# Patient Record
Sex: Female | Born: 1965 | Race: Black or African American | Hispanic: No | Marital: Married | Smoking: Never smoker
Health system: Southern US, Community
[De-identification: ages and names within clinical notes are randomized; demographics above are authoritative.]

---

## 2000-05-27 HISTORY — PX: BREAST BIOPSY: SHX20

## 2001-09-15 ENCOUNTER — Encounter: Payer: Self-pay | Admitting: Internal Medicine

## 2001-09-15 ENCOUNTER — Encounter: Admission: RE | Admit: 2001-09-15 | Discharge: 2001-09-15 | Payer: Self-pay | Admitting: Internal Medicine

## 2003-05-23 ENCOUNTER — Other Ambulatory Visit: Admission: RE | Admit: 2003-05-23 | Discharge: 2003-05-23 | Payer: Self-pay | Admitting: Internal Medicine

## 2004-06-26 ENCOUNTER — Other Ambulatory Visit: Admission: RE | Admit: 2004-06-26 | Discharge: 2004-06-26 | Payer: Self-pay | Admitting: Internal Medicine

## 2005-08-05 ENCOUNTER — Encounter: Admission: RE | Admit: 2005-08-05 | Discharge: 2005-08-05 | Payer: Self-pay | Admitting: Internal Medicine

## 2005-10-01 ENCOUNTER — Other Ambulatory Visit: Admission: RE | Admit: 2005-10-01 | Discharge: 2005-10-01 | Payer: Self-pay | Admitting: Internal Medicine

## 2005-10-08 ENCOUNTER — Encounter: Admission: RE | Admit: 2005-10-08 | Discharge: 2005-10-08 | Payer: Self-pay | Admitting: Internal Medicine

## 2006-08-08 ENCOUNTER — Encounter: Admission: RE | Admit: 2006-08-08 | Discharge: 2006-08-08 | Payer: Self-pay | Admitting: Internal Medicine

## 2007-05-06 ENCOUNTER — Other Ambulatory Visit: Admission: RE | Admit: 2007-05-06 | Discharge: 2007-05-06 | Payer: Self-pay | Admitting: Pediatrics

## 2007-08-13 ENCOUNTER — Encounter: Admission: RE | Admit: 2007-08-13 | Discharge: 2007-08-13 | Payer: Self-pay | Admitting: Family Medicine

## 2008-08-18 ENCOUNTER — Encounter: Admission: RE | Admit: 2008-08-18 | Discharge: 2008-08-18 | Payer: Self-pay | Admitting: Family Medicine

## 2008-09-08 ENCOUNTER — Encounter: Admission: RE | Admit: 2008-09-08 | Discharge: 2008-09-08 | Payer: Self-pay | Admitting: Sports Medicine

## 2008-09-08 ENCOUNTER — Ambulatory Visit: Payer: Self-pay | Admitting: Sports Medicine

## 2008-09-08 DIAGNOSIS — M25559 Pain in unspecified hip: Secondary | ICD-10-CM

## 2009-08-24 ENCOUNTER — Encounter: Admission: RE | Admit: 2009-08-24 | Discharge: 2009-08-24 | Payer: Self-pay | Admitting: Family Medicine

## 2010-06-16 ENCOUNTER — Encounter: Payer: Self-pay | Admitting: Internal Medicine

## 2010-06-29 ENCOUNTER — Other Ambulatory Visit: Payer: Self-pay

## 2010-06-29 ENCOUNTER — Other Ambulatory Visit: Payer: Self-pay | Admitting: Family Medicine

## 2010-06-29 ENCOUNTER — Other Ambulatory Visit (HOSPITAL_COMMUNITY)
Admission: RE | Admit: 2010-06-29 | Discharge: 2010-06-29 | Disposition: A | Discharge status: Home / Self Care | Payer: PRIVATE HEALTH INSURANCE | Source: Ambulatory Visit | Attending: Family Medicine | Admitting: Family Medicine

## 2010-06-29 DIAGNOSIS — Z1239 Encounter for other screening for malignant neoplasm of breast: Secondary | ICD-10-CM

## 2010-06-29 DIAGNOSIS — Z01419 Encounter for gynecological examination (general) (routine) without abnormal findings: Secondary | ICD-10-CM | POA: Insufficient documentation

## 2010-08-31 ENCOUNTER — Ambulatory Visit: Payer: PRIVATE HEALTH INSURANCE

## 2010-09-18 ENCOUNTER — Ambulatory Visit
Admission: RE | Admit: 2010-09-18 | Discharge: 2010-09-18 | Disposition: A | Discharge status: Home / Self Care | Payer: PRIVATE HEALTH INSURANCE | Source: Ambulatory Visit | Attending: Family Medicine | Admitting: Family Medicine

## 2010-09-18 DIAGNOSIS — Z1239 Encounter for other screening for malignant neoplasm of breast: Secondary | ICD-10-CM

## 2011-07-30 ENCOUNTER — Other Ambulatory Visit: Payer: Self-pay

## 2011-07-30 ENCOUNTER — Other Ambulatory Visit (HOSPITAL_COMMUNITY)
Admission: RE | Admit: 2011-07-30 | Discharge: 2011-07-30 | Disposition: A | Discharge status: Home / Self Care | Payer: PRIVATE HEALTH INSURANCE | Source: Ambulatory Visit | Attending: Family Medicine | Admitting: Family Medicine

## 2011-07-30 DIAGNOSIS — Z01419 Encounter for gynecological examination (general) (routine) without abnormal findings: Secondary | ICD-10-CM | POA: Insufficient documentation

## 2011-08-27 ENCOUNTER — Other Ambulatory Visit: Payer: Self-pay | Admitting: Family Medicine

## 2011-08-27 DIAGNOSIS — Z1231 Encounter for screening mammogram for malignant neoplasm of breast: Secondary | ICD-10-CM

## 2011-09-20 ENCOUNTER — Ambulatory Visit
Admission: RE | Admit: 2011-09-20 | Discharge: 2011-09-20 | Disposition: A | Discharge status: Home / Self Care | Payer: PRIVATE HEALTH INSURANCE | Source: Ambulatory Visit | Attending: Family Medicine | Admitting: Family Medicine

## 2011-09-20 DIAGNOSIS — Z1231 Encounter for screening mammogram for malignant neoplasm of breast: Secondary | ICD-10-CM

## 2012-02-24 ENCOUNTER — Ambulatory Visit (INDEPENDENT_AMBULATORY_CARE_PROVIDER_SITE_OTHER): Payer: BC Managed Care – PPO | Admitting: Sports Medicine

## 2012-02-24 VITALS — BP 128/90 | Ht 67.0 in | Wt 193.0 lb

## 2012-02-24 DIAGNOSIS — M25559 Pain in unspecified hip: Secondary | ICD-10-CM

## 2012-02-24 DIAGNOSIS — M533 Sacrococcygeal disorders, not elsewhere classified: Secondary | ICD-10-CM

## 2012-02-24 NOTE — Progress Notes (Signed)
  Subjective:    Patient ID: Heather Kennedy, female    DOB: August 08, 1965, 46 y.o.   MRN: [id number]  HPI Pt states that she has been having L hip pain for the last 10 days which began at mile 14 of a run.  Had to walk/run the remainder of the run.  Took Motrin 800mg  BID, then reduced to 400mg  BID. She returned to running after 2-3 days off and again felt the same pain at L hip/back.  States that she has had similar pain in the past with "sciatica."  Relieving factor: motrin/rest. Exacerbating factor: running.  Does warm-up and stretching before running.  Does stretching after running.  Does not do core exercises.  Runs on flat, paved surfaces.  Runs 25-35 miles per week in 4 days.  Is training for a marathon in [place] in November.  Has hx of BLE edema as well as "kidney problem" with extended use of Motrin   Review of Systems   All negative except as in HPI  Objective:   Physical Exam Well-developed, well-nourished. No acute distress. Awake alert and oriented x3 Bilateral SI tender to palpation, full painless lumbar range of motion BLE 5/5 motor, sensation intact distally +2 patellar, +2 Achilles L FABER + with pain elicited at groin and L SI FROM at hip flexion and extension L IT band - good motion No leg length discrepancy noted        Assessment & Plan:  L hip pain- likely SI joint etiology with component of  Piriformis and gluteus inflammation  Recommend stretching of piriformis and gluts, as well as ice to affected area, green orthotics dispensed, counseled on stretching program. Recommend core strengthening. F/U PRN

## 2012-04-02 ENCOUNTER — Encounter: Payer: Self-pay | Admitting: Sports Medicine

## 2012-04-02 ENCOUNTER — Ambulatory Visit (INDEPENDENT_AMBULATORY_CARE_PROVIDER_SITE_OTHER): Payer: BC Managed Care – PPO | Admitting: Sports Medicine

## 2012-04-02 VITALS — BP 161/99 | HR 57 | Ht 67.0 in | Wt 190.0 lb

## 2012-04-02 DIAGNOSIS — M25559 Pain in unspecified hip: Secondary | ICD-10-CM

## 2012-04-02 DIAGNOSIS — S86119A Strain of other muscle(s) and tendon(s) of posterior muscle group at lower leg level, unspecified leg, initial encounter: Secondary | ICD-10-CM

## 2012-04-02 DIAGNOSIS — S838X9A Sprain of other specified parts of unspecified knee, initial encounter: Secondary | ICD-10-CM

## 2012-04-03 NOTE — Progress Notes (Signed)
  Subjective:    Patient ID: Heather Kennedy, female    DOB: Sep 11, 1965, 46 y.o.   MRN: [id number]  HPI chief complaint: Right calf pain  46 year old female comes in today complaining of one week of right calf pain. She is training for a marathon. Marathon is on November 16. While running one week ago she began to experience some cramping in her right calf. She took a few days off when she resumed running she had an immediate return of pain. She describes an aching, cramping feeling in the right calf. Mild pain with walking but not bad. No pain more proximal in the thigh but she gets some mild occasional low back pain. No associated numbness or tingling. She's not noticed any weakness in the right leg. She's been taking intermittent doses of over-the-counter Motrin which is somewhat helpful. Denies similar problems in the past.  Medical history is unchanged since last visit    Review of Systems     Objective:   Physical Exam Well-developed, well-nourished. No acute distress  Good lumbar range of motion without pain. Negative straight leg raise bilaterally. She has tenderness to palpation along the medial head of the gastrocnemius on the right. No palpable defect. No soft tissue swelling. Mild pain with Achilles stretching. Strength is 5/5 both lower extremities. Reflexes are trace but equal at the Achilles and patellar tendons bilaterally. No noticeable muscle atrophy. Patient is able to walk on heels and toes without weakness.       Assessment & Plan:  1. Right calf pain secondary to calf strain  Patient is in the tapering phase of her training for her marathon. She may need to modify her runs based on pain. I've given her a body heel is compression sleeve to wear with activity. 3/16 inch heel lift to wear in her running shoes to help take some strain off of the calf and Achilles tendon. Home exercise program to include icing after activity and Alfredson's heel drops. She can continue to  train as symptoms allow. Followup if symptoms persist or worsen post marathon.

## 2012-07-08 ENCOUNTER — Other Ambulatory Visit: Payer: Self-pay | Admitting: Family Medicine

## 2012-07-08 DIAGNOSIS — R102 Pelvic and perineal pain: Secondary | ICD-10-CM

## 2012-07-17 ENCOUNTER — Other Ambulatory Visit (HOSPITAL_COMMUNITY)
Admission: RE | Admit: 2012-07-17 | Discharge: 2012-07-17 | Disposition: A | Discharge status: Home / Self Care | Payer: BC Managed Care – PPO | Source: Ambulatory Visit | Attending: Obstetrics and Gynecology | Admitting: Obstetrics and Gynecology

## 2012-07-17 ENCOUNTER — Ambulatory Visit
Admission: RE | Admit: 2012-07-17 | Discharge: 2012-07-17 | Disposition: A | Discharge status: Home / Self Care | Payer: BC Managed Care – PPO | Source: Ambulatory Visit | Attending: Family Medicine | Admitting: Family Medicine

## 2012-07-17 ENCOUNTER — Other Ambulatory Visit: Payer: Self-pay | Admitting: Obstetrics and Gynecology

## 2012-07-17 DIAGNOSIS — Z01419 Encounter for gynecological examination (general) (routine) without abnormal findings: Secondary | ICD-10-CM | POA: Insufficient documentation

## 2012-07-17 DIAGNOSIS — R102 Pelvic and perineal pain: Secondary | ICD-10-CM

## 2012-07-17 DIAGNOSIS — Z1151 Encounter for screening for human papillomavirus (HPV): Secondary | ICD-10-CM | POA: Insufficient documentation

## 2012-07-20 ENCOUNTER — Other Ambulatory Visit: Payer: Self-pay | Admitting: Obstetrics and Gynecology

## 2012-08-04 ENCOUNTER — Other Ambulatory Visit: Payer: Self-pay

## 2012-09-25 ENCOUNTER — Ambulatory Visit
Admission: RE | Admit: 2012-09-25 | Discharge: 2012-09-25 | Disposition: A | Discharge status: Home / Self Care | Payer: BC Managed Care – PPO | Source: Ambulatory Visit

## 2012-09-25 DIAGNOSIS — Z1231 Encounter for screening mammogram for malignant neoplasm of breast: Secondary | ICD-10-CM

## 2012-11-03 ENCOUNTER — Other Ambulatory Visit: Payer: Self-pay | Admitting: Obstetrics and Gynecology

## 2012-11-03 DIAGNOSIS — D390 Neoplasm of uncertain behavior of uterus: Secondary | ICD-10-CM

## 2012-11-11 ENCOUNTER — Other Ambulatory Visit: Payer: BC Managed Care – PPO

## 2012-11-13 ENCOUNTER — Ambulatory Visit
Admission: RE | Admit: 2012-11-13 | Discharge: 2012-11-13 | Disposition: A | Discharge status: Home / Self Care | Payer: BC Managed Care – PPO | Source: Ambulatory Visit | Attending: Obstetrics and Gynecology | Admitting: Obstetrics and Gynecology

## 2012-11-13 DIAGNOSIS — D390 Neoplasm of uncertain behavior of uterus: Secondary | ICD-10-CM

## 2012-11-13 MED ORDER — IOHEXOL 300 MG/ML  SOLN
125.0000 mL | Freq: Once | INTRAMUSCULAR | Status: AC | PRN
  Administered 2012-11-13: 125 mL via INTRAVENOUS

## 2013-09-14 ENCOUNTER — Other Ambulatory Visit: Payer: Self-pay

## 2013-09-14 DIAGNOSIS — Z1231 Encounter for screening mammogram for malignant neoplasm of breast: Secondary | ICD-10-CM

## 2013-10-01 ENCOUNTER — Ambulatory Visit
Admission: RE | Admit: 2013-10-01 | Discharge: 2013-10-01 | Disposition: A | Discharge status: Home / Self Care | Payer: BC Managed Care – PPO | Source: Ambulatory Visit

## 2013-10-01 ENCOUNTER — Encounter (INDEPENDENT_AMBULATORY_CARE_PROVIDER_SITE_OTHER): Payer: Self-pay

## 2013-10-01 DIAGNOSIS — Z1231 Encounter for screening mammogram for malignant neoplasm of breast: Secondary | ICD-10-CM

## 2013-10-27 ENCOUNTER — Ambulatory Visit: Payer: Worker's Compensation | Attending: Family Medicine

## 2013-10-27 DIAGNOSIS — M545 Low back pain, unspecified: Secondary | ICD-10-CM | POA: Insufficient documentation

## 2013-10-27 DIAGNOSIS — IMO0001 Reserved for inherently not codable concepts without codable children: Secondary | ICD-10-CM | POA: Insufficient documentation

## 2013-10-27 DIAGNOSIS — R5381 Other malaise: Secondary | ICD-10-CM | POA: Insufficient documentation

## 2013-10-28 ENCOUNTER — Ambulatory Visit: Payer: Worker's Compensation | Admitting: Physical Therapy

## 2013-11-02 ENCOUNTER — Ambulatory Visit: Payer: Worker's Compensation

## 2013-11-02 ENCOUNTER — Ambulatory Visit: Payer: BC Managed Care – PPO

## 2013-11-04 ENCOUNTER — Ambulatory Visit: Payer: Worker's Compensation | Admitting: Physical Therapy

## 2013-11-09 ENCOUNTER — Ambulatory Visit: Payer: Worker's Compensation

## 2013-11-12 ENCOUNTER — Ambulatory Visit: Payer: Worker's Compensation | Admitting: Physical Therapy

## 2013-11-16 ENCOUNTER — Ambulatory Visit: Payer: Worker's Compensation

## 2013-11-18 ENCOUNTER — Ambulatory Visit: Payer: Worker's Compensation

## 2013-11-23 ENCOUNTER — Ambulatory Visit: Payer: Worker's Compensation | Admitting: Physical Therapy

## 2013-11-24 ENCOUNTER — Ambulatory Visit: Payer: Worker's Compensation | Attending: Family Medicine | Admitting: Physical Therapy

## 2013-11-24 DIAGNOSIS — IMO0001 Reserved for inherently not codable concepts without codable children: Secondary | ICD-10-CM | POA: Insufficient documentation

## 2013-11-24 DIAGNOSIS — M545 Low back pain, unspecified: Secondary | ICD-10-CM | POA: Insufficient documentation

## 2013-11-24 DIAGNOSIS — R5381 Other malaise: Secondary | ICD-10-CM | POA: Insufficient documentation

## 2013-11-30 ENCOUNTER — Ambulatory Visit: Payer: Worker's Compensation | Admitting: Physical Therapy

## 2013-12-03 ENCOUNTER — Ambulatory Visit: Payer: Worker's Compensation | Admitting: Physical Therapy

## 2013-12-07 ENCOUNTER — Ambulatory Visit: Payer: Worker's Compensation

## 2013-12-10 ENCOUNTER — Ambulatory Visit: Payer: Worker's Compensation | Admitting: Physical Therapy

## 2013-12-14 ENCOUNTER — Ambulatory Visit: Payer: Worker's Compensation

## 2013-12-17 ENCOUNTER — Ambulatory Visit: Payer: Worker's Compensation | Admitting: Physical Therapy

## 2013-12-17 ENCOUNTER — Ambulatory Visit: Payer: Worker's Compensation

## 2013-12-17 ENCOUNTER — Other Ambulatory Visit: Payer: Self-pay | Admitting: Occupational Medicine

## 2013-12-17 DIAGNOSIS — R52 Pain, unspecified: Secondary | ICD-10-CM

## 2013-12-21 ENCOUNTER — Ambulatory Visit: Payer: Worker's Compensation

## 2013-12-24 ENCOUNTER — Encounter: Payer: Worker's Compensation | Admitting: Physical Therapy

## 2013-12-27 ENCOUNTER — Ambulatory Visit: Payer: Worker's Compensation | Attending: Family Medicine | Admitting: Physical Therapy

## 2013-12-27 DIAGNOSIS — M545 Low back pain, unspecified: Secondary | ICD-10-CM | POA: Insufficient documentation

## 2013-12-27 DIAGNOSIS — IMO0001 Reserved for inherently not codable concepts without codable children: Secondary | ICD-10-CM | POA: Diagnosis present

## 2013-12-27 DIAGNOSIS — R5381 Other malaise: Secondary | ICD-10-CM | POA: Insufficient documentation

## 2013-12-31 ENCOUNTER — Encounter: Payer: Worker's Compensation | Admitting: Physical Therapy

## 2014-01-14 ENCOUNTER — Ambulatory Visit: Payer: Worker's Compensation | Admitting: Physical Therapy

## 2014-01-21 ENCOUNTER — Encounter: Payer: Worker's Compensation | Admitting: Physical Therapy

## 2014-08-22 ENCOUNTER — Ambulatory Visit (INDEPENDENT_AMBULATORY_CARE_PROVIDER_SITE_OTHER): Payer: BLUE CROSS/BLUE SHIELD

## 2014-08-22 ENCOUNTER — Encounter: Payer: Self-pay | Admitting: Podiatry

## 2014-08-22 ENCOUNTER — Ambulatory Visit (INDEPENDENT_AMBULATORY_CARE_PROVIDER_SITE_OTHER): Payer: BLUE CROSS/BLUE SHIELD | Admitting: Podiatry

## 2014-08-22 VITALS — BP 124/86 | HR 69 | Resp 15

## 2014-08-22 DIAGNOSIS — M79671 Pain in right foot: Secondary | ICD-10-CM

## 2014-08-22 DIAGNOSIS — M779 Enthesopathy, unspecified: Secondary | ICD-10-CM

## 2014-08-22 MED ORDER — TRIAMCINOLONE ACETONIDE 10 MG/ML IJ SUSP
10.0000 mg | Freq: Once | INTRAMUSCULAR | Status: AC
  Administered 2014-08-22: 10 mg

## 2014-08-22 NOTE — Progress Notes (Signed)
   Subjective:    Patient ID: Heather Kennedy, female    DOB: 02/28/1966, 49 y.o.   MRN: [id number]  HPI Pt presents with pain in right foot, she has previously been seen by ortho, has tried OTC orthotics and NSAIDS. She states that pain worsens when walking for prolonged periods. Has tried braces in the past, but no relief   Review of Systems  Constitutional: Positive for unexpected weight change.  HENT: Positive for rhinorrhea.   Musculoskeletal: Positive for myalgias, back pain and arthralgias.  Allergic/Immunologic: Positive for environmental allergies and food allergies.  All other systems reviewed and are negative.      Objective:   Physical Exam        Assessment & Plan:

## 2014-08-23 NOTE — Progress Notes (Signed)
Subjective:     Patient ID: Heather Kennedy, female   DOB: May 19, 1966, 49 y.o.   MRN: [id number]  HPI patient presents with a long history of pain in her right foot and states she's had over-the-counter insoles has tried anti-inflammatories and induced activity without relief   Review of Systems  All other systems reviewed and are negative.      Objective:   Physical Exam  Constitutional: She is oriented to person, place, and time.  Cardiovascular: Intact distal pulses.   Musculoskeletal: Normal range of motion.  Neurological: She is oriented to person, place, and time.  Skin: Skin is warm.  Nursing note and vitals reviewed.  neurovascular status intact with muscle strength adequate and range of motion subtalar midtarsal joint within normal limits. Moderate depression of the arch noted and posterior tibial function right appears to be intact but may be slightly compromised. Patient is walking with a slightly abnormal gait and pain is mostly palpable to the navicular insertion of the posterior tibial tendon. Patient has good digital perfusion and is well oriented 3     Assessment:     Probable posterior tibial tendinitis right with moderate depression of the arch is part of the process    Plan:     H&P and education the patient rendered along with review of x-ray. Today careful sheath injection administered 3 mg dexamethasone Kenalog 5 mg Xylocaine and I did explain the risk of this prior to the procedure. I then applied fascial brace placed on anti-inflammatory and discussed long-term orthotics. Reappoint to recheck

## 2014-08-29 ENCOUNTER — Ambulatory Visit: Payer: BLUE CROSS/BLUE SHIELD | Admitting: Podiatry

## 2014-08-29 ENCOUNTER — Ambulatory Visit: Payer: Self-pay | Admitting: Podiatry

## 2014-08-30 ENCOUNTER — Ambulatory Visit (INDEPENDENT_AMBULATORY_CARE_PROVIDER_SITE_OTHER): Payer: BLUE CROSS/BLUE SHIELD | Admitting: Podiatry

## 2014-08-30 ENCOUNTER — Encounter: Payer: Self-pay | Admitting: Podiatry

## 2014-08-30 VITALS — BP 134/75 | HR 73 | Resp 13

## 2014-08-30 DIAGNOSIS — M779 Enthesopathy, unspecified: Secondary | ICD-10-CM

## 2014-08-30 DIAGNOSIS — M79671 Pain in right foot: Secondary | ICD-10-CM

## 2014-08-31 ENCOUNTER — Other Ambulatory Visit: Payer: Self-pay

## 2014-08-31 DIAGNOSIS — Z1231 Encounter for screening mammogram for malignant neoplasm of breast: Secondary | ICD-10-CM

## 2014-08-31 NOTE — Progress Notes (Signed)
Subjective:     Patient ID: Heather Kennedy, female   DOB: 24-Oct-1965, 49 y.o.   MRN: [id number]  HPI patient presents stating on my right foot the tendon is feeling better but I still have some pain within the foot itself and I have not yet resumed my normal significant activities   Review of Systems     Objective:   Physical Exam Neurovascular status intact muscle strength adequate range of motion within normal limits with moderate discomfort upon palpation to the tendon complex right ankle around the posterior tibial tendon. 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. I've recommended long-term orthotics and scanned for custom orthotic devices. She will have these made after getting this approved and will be seen back for [place] to dispense

## 2014-09-07 ENCOUNTER — Ambulatory Visit: Payer: BLUE CROSS/BLUE SHIELD | Admitting: *Deleted

## 2014-09-07 DIAGNOSIS — M779 Enthesopathy, unspecified: Secondary | ICD-10-CM

## 2014-09-07 NOTE — Patient Instructions (Signed)

## 2014-09-07 NOTE — Progress Notes (Signed)
Patient ID: Heather Kennedy, female   DOB: 1965/12/22, 49 y.o.   MRN: [id number] RIGHT FOOT ROLLING OUT EVEN BEFORE ORTHOTICS WRITTEN BREAK IN INSTRUCTIONS GIVEN RECOMMEND PATIENT FOLLOW UP 1 WEEK WITH DR Paulla Dolly

## 2014-10-03 ENCOUNTER — Ambulatory Visit: Payer: Worker's Compensation

## 2014-10-03 ENCOUNTER — Ambulatory Visit
Admission: RE | Admit: 2014-10-03 | Discharge: 2014-10-03 | Disposition: A | Discharge status: Home / Self Care | Payer: BLUE CROSS/BLUE SHIELD | Source: Ambulatory Visit

## 2014-10-03 ENCOUNTER — Encounter (INDEPENDENT_AMBULATORY_CARE_PROVIDER_SITE_OTHER): Payer: Self-pay

## 2014-10-03 DIAGNOSIS — Z1231 Encounter for screening mammogram for malignant neoplasm of breast: Secondary | ICD-10-CM

## 2014-10-04 ENCOUNTER — Ambulatory Visit (INDEPENDENT_AMBULATORY_CARE_PROVIDER_SITE_OTHER): Payer: BLUE CROSS/BLUE SHIELD | Admitting: Podiatry

## 2014-10-04 ENCOUNTER — Encounter: Payer: Self-pay | Admitting: Podiatry

## 2014-10-04 VITALS — BP 132/82 | HR 66 | Resp 18

## 2014-10-04 DIAGNOSIS — M779 Enthesopathy, unspecified: Secondary | ICD-10-CM | POA: Diagnosis not present

## 2014-10-04 MED ORDER — TRIAMCINOLONE ACETONIDE 10 MG/ML IJ SUSP: 10.0000 mg | Freq: Once | INTRAMUSCULAR | Status: DC

## 2014-10-05 NOTE — Progress Notes (Signed)
Subjective:     Patient ID: Heather Kennedy, female   DOB: [birth year], 49 y.o.   MRN: [id number]  HPI patient states the quite a bit of my pain is reduced  One small area still noted and my orthotics still bother me with certain types shoes I cannot wear them all the time. They do feel very good and tennis shoes and walking shoes   Review of Systems     Objective:   Physical Exam Neurovascular status intact with significant diminishment of discomfort around the posterior tibial insertion right and discomfort in a more dorsal direction around the ankle joint just medial to the anterior tibial tendon    Assessment:     Improve posterior tibial tendinitis right with dorsal inflammation which may be compensatory in nature    Plan:     Small injection of 2 mg dexamethasone Kenalog 5 mg Xylocaine administered into this new area and advised on ice therapy and supportive shoe gear therapy. Patient will have a second pair of orthotics made for shoes that she cannot wear the original pair with and will be seen back when those are ready

## 2015-01-20 ENCOUNTER — Encounter: Payer: Self-pay | Admitting: Podiatry

## 2015-01-20 ENCOUNTER — Ambulatory Visit (INDEPENDENT_AMBULATORY_CARE_PROVIDER_SITE_OTHER): Payer: BLUE CROSS/BLUE SHIELD | Admitting: Podiatry

## 2015-01-20 VITALS — BP 130/80 | HR 62 | Resp 18

## 2015-01-20 DIAGNOSIS — M779 Enthesopathy, unspecified: Secondary | ICD-10-CM | POA: Diagnosis not present

## 2015-01-20 DIAGNOSIS — M722 Plantar fascial fibromatosis: Secondary | ICD-10-CM | POA: Diagnosis not present

## 2015-01-20 MED ORDER — METHYLPREDNISOLONE 4 MG PO TBPK: ORAL_TABLET | ORAL | Status: DC

## 2015-01-20 NOTE — Patient Instructions (Signed)
Posterior Tibial Tendon Tendinitis with Rehab Tendonitis is a condition that is characterized by inflammation of a tendon or the lining (sheath) that surrounds it. The inflammation is usually caused by damage to the tendon, such as a tendon tear (strain). Sprains are classified into three categories. Grade 1 sprains cause pain, but the tendon is not lengthened. Grade 2 sprains include a lengthened ligament due to the ligament being stretched or partially ruptured. With grade 2 sprains there is still function, although the function may be diminished. Grade 3 sprains are characterized by a complete tear of the tendon or muscle, and function is usually impaired. Posterior tibialis tendonitis is tendonitis of the posterior tibial tendon, which attaches muscles of the lower leg to the foot. The posterior tibial tendon is located in the back of the ankle and helps the body straighten (plantar flex) and rotate inward (medially rotate) the ankle. SYMPTOMS   Pain, tenderness, swelling, warmth, and/or redness over the back of the inner ankle at the posterior tibial tendon or the inner part of the mid-foot.  Pain that worsens with plantar flexion or medial rotation of the ankle.  A crackling sound (crepitation) when the tendon is moved or touched. CAUSES  Posterior tibial tendonitis occurs when damage to the posterior tibial tendon starts an inflammatory response. Common mechanisms of injury include:  Degenerative (occurs with aging) processes that weaken the tendon and make it more susceptible to injury.  Stress placed on the tendon from an increase in the intensity, frequency, or duration of training.  Direct trauma to the ankle.  Returning to activity before a previous ankle injury is allowed to heal. RISK INCREASES WITH:  Activities that involve repetitive and/or stressful plantar flexion (jumping, kicking, or running up/down hills).  Poor strength and flexibility.  Flat feet.  Previous injury  to the foot, ankle, or leg. PREVENTION   Warm up and stretch properly before activity.  Allow for adequate recovery between workouts.  Maintain physical fitness:  Strength, flexibility, and endurance.  Cardiovascular fitness.  Learn and use proper technique. When possible, have a coach correct improper technique.  Complete rehabilitation from a previous foot, ankle, or leg injury.  If you have flat feet, wear arch supports (orthotics). PROGNOSIS  If treated properly, the symptoms of tendonitis usually resolve within 6 weeks. This period may be shorter for injuries caused by direct trauma. RELATED COMPLICATIONS   Prolonged healing time, if improperly treated or reinjured.  Recurrent symptoms that result in a chronic problem.  Partial or complete tendon tear (rupture) requiring surgery. TREATMENT  Treatment initially involves the use of ice and medication to help reduce pain and inflammation. The use of strengthening and stretching exercises may help reduce pain with activity. These exercises may be performed at home or with referral to a therapist. Often times, your caregiver will recommend immobilizing the ankle to allow the tendon to heal. If you have flat feet, you may be advised to wear orthotic arch supports. If symptoms persist for greater than 6 months despite nonsurgical (conservative) treatment, then surgery may be recommended. MEDICATION   If pain medication is necessary, then nonsteroidal anti-inflammatory medications, such as aspirin and ibuprofen, or other minor pain relievers, such as acetaminophen, are often recommended.  Do not take pain medication for 7 days before surgery.  Prescription pain relievers may be given if deemed necessary by your caregiver. Use only as directed and only as much as you need.  Corticosteroid injections may be given by your caregiver. These injections should  be reserved for the most serious cases because they may only be given a certain  number of times. HEAT AND COLD  Cold treatment (icing) relieves pain and reduces inflammation. Cold treatment should be applied for 10 to 15 minutes every 2 to 3 hours for inflammation and pain and immediately after any activity that aggravates your symptoms. Use ice packs or massage the area with a piece of ice (ice massage).  Heat treatment may be used prior to performing the stretching and strengthening activities prescribed by your caregiver, physical therapist, or athletic trainer. Use a heat pack or soak the injury in warm water. SEEK MEDICAL CARE IF:  Treatment seems to offer no benefit, or the condition worsens.  Any medications produce adverse side effects. EXERCISES RANGE OF MOTION (ROM) AND STRETCHING EXERCISES - Posterior Tibial Tendon Tendinitis These exercises may help you when beginning to rehabilitate your injury. Your symptoms may resolve with or without further involvement from your physician, physical therapist or athletic trainer. While completing these exercises, remember:   Restoring tissue flexibility helps normal motion to return to the joints. This allows healthier, less painful movement and activity.  An effective stretch should be held for at least 30 seconds.  A stretch should never be painful. You should only feel a gentle lengthening or release in the stretched tissue. RANGE OF MOTION - Ankle Plantar Flexion   Sit with your right / left leg crossed over your opposite knee.  Use your opposite hand to pull the top of your foot and toes toward you.  You should feel a gentle stretch on the top of your foot/ankle. Hold this position for __________ seconds. Repeat __________ times. Complete this exercise __________ times per day.  RANGE OF MOTION - Ankle Eversion   Sit with your right / left ankle crossed over your opposite knee.  Grip your foot with your opposite hand, placing your thumb on the top of your foot and your fingers across the bottom of your  foot.  Gently push your foot downward with a slight rotation so your littlest toes rise slightly.  You should feel a gentle stretch on the inside of your ankle. Hold the stretch for __________ seconds. Repeat __________ times. Complete this exercise __________ times per day.  RANGE OF MOTION - Ankle Inversion   Sit with your right / left ankle crossed over your opposite knee.  Grip your foot with your opposite hand, placing your thumb on the bottom of your foot and your fingers across the top of your foot.  Gently pull your foot so the smallest toe comes toward you and your thumb pushes the inside of the ball of your foot away from you.  You should feel a gentle stretch on the outside of your ankle. Hold the stretch for __________ seconds. Repeat __________ times. Complete this exercise __________ times per day.  RANGE OF MOTION - Dorsi/Plantar Flexion  While sitting with your right / left knee straight, draw the top of your foot upward by flexing your ankle. Then reverse the motion, pointing your toes downward.  Hold each position for __________ seconds.  After completing your first set of exercises, repeat this exercise with your knee bent. Repeat __________ times. Complete this exercise __________ times per day.  RANGE OF MOTION - Ankle Alphabet  Imagine your right / left big toe is a pen.  Keeping your hip and knee still, write out the entire alphabet with your "pen." Make the letters as large as you can without   increasing any discomfort. Repeat __________ times. Complete this exercise __________ times per day.  STRETCH - Gastrocsoleus   Sit with your right / left leg extended. Holding onto both ends of a belt or towel, loop it around the ball of your foot.  Keeping your right / left ankle and foot relaxed and your knee straight, pull your foot and ankle toward you using the belt/towel.  You should feel a gentle stretch behind your calf or knee. Hold this position for  __________ seconds. Repeat __________ times. Complete this exercise __________ times per day.  STRETCH - Gastroc, Standing   Place hands on wall.  Extend right / left leg, keeping the front knee somewhat bent.  Slightly point your toes inward on your back foot.  Keeping your right / left heel on the floor and your knee straight, shift your weight toward the wall, not allowing your back to arch.  You should feel a gentle stretch in the right / left calf. Hold this position for __________ seconds. Repeat __________ times. Complete this stretch __________ times per day. STRETCH - Soleus, Standing   Place hands on wall.  Extend right / left leg, keeping the other knee somewhat bent.  Slightly point your toes inward on your back foot.  Keep your right / left heel on the floor, bend your back knee, and slightly shift your weight over the back leg so that you feel a gentle stretch deep in your back calf.  Hold this position for __________ seconds. Repeat __________ times. Complete this stretch __________ times per day. STRENGTHENING EXERCISES - Posterior Tibial Tendon Tendinitis These exercises may help you when beginning to rehabilitate your injury. They may resolve your symptoms with or without further involvement from your physician, physical therapist, or athletic trainer. While completing these exercises, remember:   Muscles can gain both the endurance and the strength needed for everyday activities through controlled exercises.  Complete these exercises as instructed by your physician, physical therapist, or athletic trainer. Progress the resistance and repetitions only as guided. STRENGTH - Dorsiflexors  Secure a rubber exercise band/tubing to a fixed object (i.e., table, pole) and loop the other end around your right / left foot.  Sit on the floor facing the fixed object. The band/tubing should be slightly tense when your foot is relaxed.  Slowly draw your foot back toward you  using your ankle and toes.  Hold this position for __________ seconds. Slowly release the tension in the band and return your foot to the starting position. Repeat __________ times. Complete this exercise __________ times per day.  STRENGTH - Towel Curls  Sit in a chair positioned on a non-carpeted surface.  Place your foot on a towel, keeping your heel on the floor.  Pull the towel toward your heel by only curling your toes. Keep your heel on the floor.  If instructed by your physician, physical therapist, or athletic trainer, add ____________________ at the end of the towel. Repeat __________ times. Complete this exercise __________ times per day. STRENGTH - Ankle Eversion   Secure one end of a rubber exercise band/tubing to a fixed object (table, pole). Loop the other end around your foot just before your toes.  Place your fists between your knees. This will focus your strengthening at your ankle.  Drawing the band/tubing across your opposite foot, slowly pull your little toe out and up. Make sure the band/tubing is positioned to resist the entire motion.  Hold this position for __________ seconds.  Have   your muscles resist the band/tubing as it slowly pulls your foot back to the starting position. Repeat __________ times. Complete this exercise __________ times per day.  STRENGTH - Ankle Inversion   Secure one end of a rubber exercise band/tubing to a fixed object (table, pole). Loop the other end around your foot just before your toes.  Place your fists between your knees. This will focus your strengthening at your ankle.  Slowly, pull your big toe up and in, making sure the band/tubing is positioned to resist the entire motion.  Hold this position for __________ seconds.  Have your muscles resist the band/tubing as it slowly pulls your foot back to the starting position. Repeat __________ times. Complete this exercises __________ times per day.  Document Released:  05/13/2005 Document Revised: 09/27/2013 Document Reviewed: 08/25/2008 [facility] Patient Information 2015 [place], [place]. This information is not intended to replace advice given to you by your health care provider. Make sure you discuss any questions you have with your health care provider. Peroneal Tendinitis with Rehab Tendonitis is inflammation of a tendon. Inflammation of the tendons on the back of the outer ankle (peroneal tendons) is known as peroneal tendonitis. The peroneal tendons are responsible for connecting the muscles that allow you to stand on your tiptoes to the bones of the ankle. For this reason, peroneal tendonitis often causes pain when trying to complete such motions. Peroneal tendonitis often involves a tear (strain) of the peroneal tendons. Strains are classified into three categories. Grade 1 strains cause pain, but the tendon is not lengthened. Grade 2 strains include a lengthened ligament, due to the ligament being stretched or partially ruptured. With grade 2 strains there is still function, although function may be decreased. Grade 3 strains involve a complete tear of the tendon or muscle, and function is usually impaired. SYMPTOMS   Pain, tenderness, swelling, warmth, or redness over the back of the outer side of the ankle, the outer part of the mid-foot, or the bottom of the arch.  Pain that gets worse with ankle motion (especially when pushing off or pushing down with the front of the foot), or when standing on the ball of the foot or pushing the foot outward.  Crackling sound (crepitation) when the tendon is moved or touched. CAUSES  Peroneal tendinitis occurs when injury to the peroneal tendons causes the body to respond with inflammation. Common causes of injury include:  An overuse injury, in which the groove behind the outer ankle (where the tendon is located) causes wear on the tendon.  A sudden stress placed on the tendon, such as from an increase in the  intensity, frequency, or duration of training.  Direct hit (trauma) to the tendon.  Return to activity too soon after a previous ankle injury. RISK INCREASES WITH:  Sports that require sudden, repetitive pushing off of the foot, such as jumping or quick starts.  Kicking and running sports, especially running down hills or long distances.  Poor strength and flexibility.  Previous injury to the foot, ankle, or leg. PREVENTION  Warm up and stretch properly before activity.  Allow for adequate recovery between workouts.  Maintain physical fitness:  Strength, flexibility, and endurance.  Cardiovascular fitness.  Complete rehabilitation after previous injury. PROGNOSIS  If treated properly, peroneal tendonitis usually heals within 6 weeks.  RELATED COMPLICATIONS  Longer healing time, if not properly treated or if not given enough time to heal.  Recurring symptoms if activity is resumed too soon, with overuse, or when using  poor technique.  If untreated, tendinitis may result in tendon rupture, requiring surgery. TREATMENT  Treatment first involves the use of ice and medicine to reduce pain and inflammation. The use of strengthening and stretching exercises may help reduce pain with activity. These exercises may be performed at home or with a therapist. Sometimes, the foot and ankle will be restrained for 10 to 14 days to promote healing. Your caregiver may advise that you place a heel lift in your shoes to reduce the stress placed on the tendon. If nonsurgical treatment is unsuccessful, surgery to remove the inflamed tendon lining (sheath) may be advised.  MEDICATION   If pain medicine is needed, nonsteroidal anti-inflammatory medicines (aspirin and ibuprofen), or other minor pain relievers (acetaminophen), are often advised.  Do not take pain medicine for 7 days before surgery.  Prescription pain relievers may be given, if your caregiver thinks they are needed. Use only as  directed and only as much as you need. HEAT AND COLD  Cold treatment (icing) should be applied for 10 to 15 minutes every 2 to 3 hours for inflammation and pain, and immediately after activity that aggravates your symptoms. Use ice packs or an ice massage.  Heat treatment may be used before performing stretching and strengthening activities prescribed by your caregiver, physical therapist, or athletic trainer. Use a heat pack or a warm water soak. SEEK MEDICAL CARE IF:  Symptoms get worse or do not improve in 2 to 4 weeks, despite treatment.  New, unexplained symptoms develop. (Drugs used in treatment may produce side effects.) EXERCISES RANGE OF MOTION (ROM) AND STRETCHING EXERCISES - Peroneal Tendinitis These exercises may help you when beginning to rehabilitate your injury. Your symptoms may resolve with or without further involvement from your physician, physical therapist or athletic trainer. While completing these exercises, remember:   Restoring tissue flexibility helps normal motion to return to the joints. This allows healthier, less painful movement and activity.  An effective stretch should be held for at least 30 seconds.  A stretch should never be painful. You should only feel a gentle lengthening or release in the stretched tissue. RANGE OF MOTION - Ankle Eversion  Sit with your right / left ankle crossed over your opposite knee.  Grip your foot with your opposite hand, placing your thumb on the top of your foot and your fingers across the bottom of your foot.  Gently push your foot downward with a slight rotation, so your littlest toes rise slightly toward the ceiling.  You should feel a gentle stretch on the inside of your ankle. Hold the stretch for __________ seconds. Repeat __________ times. Complete this exercise __________ times per day.  RANGE OF MOTION - Ankle Inversion  Sit with your right / left ankle crossed over your opposite knee.  Grip your foot with  your opposite hand, placing your thumb on the bottom of your foot and your fingers across the top of your foot.  Gently pull your foot so the smallest toe comes toward you and your thumb pushes the inside of the ball of your foot away from you.  You should feel a gentle stretch on the outside of your ankle. Hold the stretch for __________ seconds. Repeat __________ times. Complete this exercise __________ times per day.  RANGE OF MOTION - Ankle Plantar Flexion  Sit with your right / left leg crossed over your opposite knee.  Use your opposite hand to pull the top of your foot and toes toward you.  You  should feel a gentle stretch on the top of your foot and ankle. Hold this position for __________ seconds. Repeat __________ times. Complete __________ times per day.  STRETCH - Gastroc, Standing  Place your hands on a wall.  Extend your right / left leg behind you, keeping the front knee somewhat bent.  Slightly point your toes inward on your back foot.  Keeping your right / left heel on the floor and your knee straight, shift your weight toward the wall, not allowing your back to arch.  You should feel a gentle stretch in the calf. Hold this position for __________ seconds. Repeat __________ times. Complete this stretch __________ times per day. STRETCH - Soleus, Standing  Place your hands on a wall.  Extend your right / left leg behind you, keeping the other knee somewhat bent.  Slightly point your toes inward on your back foot.  Keep your heel on the floor, bend your back knee, and slightly shift your weight over the back leg so that you feel a gentle stretch deep in your back calf.  Hold this position for __________ seconds. Repeat __________ times. Complete this stretch __________ times per day. STRETCH - Gastrocsoleus, Standing Note: This exercise can place a lot of stress on your foot and ankle. Please complete this exercise only if specifically instructed by your  caregiver.   Place the ball of your right / left foot on a step, keeping your other foot firmly on the same step.  Hold on to the wall or a rail for balance.  Slowly lift your other foot, allowing your body weight to press your heel down over the edge of the step.  You should feel a stretch in your right / left calf.  Hold this position for __________ seconds.  Repeat this exercise with a slight bend in your knee. Repeat __________ times. Complete this stretch __________ times per day.  STRENGTHENING EXERCISES - Peroneal Tendinitis  These exercises may help you when beginning to rehabilitate your injury. They may resolve your symptoms with or without further involvement from your physician, physical therapist or athletic trainer. While completing these exercises, remember:   Muscles can gain both the endurance and the strength needed for everyday activities through controlled exercises.  Complete these exercises as instructed by your physician, physical therapist or athletic trainer. Increase the resistance and repetitions only as guided by your caregiver. STRENGTH - Dorsiflexors  Secure a rubber exercise band or tubing to a fixed object (table, pole) and loop the other end around your right / left foot.  Sit on the floor facing the fixed object. The band should be slightly tense when your foot is relaxed.  Slowly draw your foot back toward you, using your ankle and toes.  Hold this position for __________ seconds. Slowly release the tension in the band and return your foot to the starting position. Repeat __________ times. Complete this exercise __________ times per day.  STRENGTH - Towel Curls  Sit in a chair, on a non-carpeted surface.  Place your foot on a towel, keeping your heel on the floor.  Pull the towel toward your heel only by curling your toes. Keep your heel on the floor.  If instructed by your physician, physical therapist or athletic trainer, add weight to the  end of the towel. Repeat __________ times. Complete this exercise __________ times per day. STRENGTH - Ankle Eversion   Secure one end of a rubber exercise band or tubing to a fixed object (table, pole).  Loop the other end around your foot, just before your toes.  Place your fists between your knees. This will focus your strengthening at your ankle.  Drawing the band across your opposite foot, away from the pole, slowly, pull your little toe out and up. Make sure the band is positioned to resist the entire motion.  Hold this position for __________ seconds.  Have your muscles resist the band, as it slowly pulls your foot back to the starting position. Repeat __________ times. Complete this exercise __________ times per day.  Document Released: 05/13/2005 Document Revised: 09/27/2013 Document Reviewed: 08/25/2008 [facility] Patient Information 2015 [place], [place]. This information is not intended to replace advice given to you by your health care provider. Make sure you discuss any questions you have with your health care provider.

## 2015-01-24 ENCOUNTER — Ambulatory Visit (INDEPENDENT_AMBULATORY_CARE_PROVIDER_SITE_OTHER): Payer: BLUE CROSS/BLUE SHIELD | Admitting: Sports Medicine

## 2015-01-24 ENCOUNTER — Encounter: Payer: Self-pay | Admitting: Sports Medicine

## 2015-01-24 VITALS — BP 154/86 | HR 62 | Ht 68.0 in | Wt 210.0 lb

## 2015-01-24 DIAGNOSIS — M25552 Pain in left hip: Secondary | ICD-10-CM | POA: Diagnosis not present

## 2015-01-24 NOTE — Progress Notes (Signed)
Patient ID: Heather Kennedy, female   DOB: April 23, 1966, 49 y.o.   MRN: [id number]  HPI: Ms. Heather Kennedy is a 49 year old female a history of right piriformis pain presenting with a few month history of L hip pain.  States she "tweaked" her hip during a [employer] exercise class, unable to describe the exact sensation, other than she "did not feel normal."  Has not had recent increase in training regimen (running or cross training).  Pain is dull and diffuse over the left hip, sometimes localizing to the groin or mid-buttock and radiating down posterior thight.  Endorses L hip stiffness with prolonged sitting and mild buttock paraesthesias with prolonged sitting. Will sometimes limp after prolonged use. Cannot lie on left side at night due to pain.  Was recently given prednisone burst for plantar fasciitis pain by an outpatient provider, which has improved her hip pain. She has a few upcoming races.   Filed Vitals:   01/24/15 1026  BP: 154/86  Pulse: 62   Physical Exam: General: Middle-aged female sitting comfortably on exam table in no apparent distress.  Trunk: Full ROM with active flexion, extension to 10-15 degrees causes "tightness" in patient's left side. Ttp with L SI joint and gluteus medius.  Lower extremities: Full active and passive range of motion with mild L hip discomfort localized laterally with internal rotation. Ttp over lateral aspect of L hip, localized to greater trochanter, does not track IT Band.  4/5 hip abductor strength, hip strength otherwise normal. FADDIR negative, FABER positive on L - endorses pain in groin and in buttock.  Assessment/Plan:  Ms. Heather Kennedy is a 49 year old female a history of right piriformis pain presenting with a few month history of L hip pain found to have normal hip ROM, hip abductor weakness, and positive FABER and localized lateral ttp on L hip.  Due to the diffuse nature of her pain, not specifically over her groin/buttocks and occurred in the  setting of activity, suspect this pain is related to tensor fascia lata strain. Other differential items considered included femoral neck stress fracture, trochanteric bursitis, osteoarthritis/FAI, and piriformis syndrome.  Patient's running and age place her at risk for femoral SF; however, had little to no pain with internal rotation and flexion of the hip. Her history/exam support possibly pain components of bursitis and piriformis syndrome, which could show some improvement with recent prednisone.  Less concerned for osteoarthritis/FAI as patient has full ROM; however, improvement with prednisone does support. - Discussed no immediate concern for fracture, but if pain continues or acutely worsens to please return - Given HEP for hip abductor strengthening and discussed continuation of home piriformis stretches - Will re-eval in 1 month and consider further imaging (plain films) vs cortisone injection at that point pending clinical improvement  Bland Span, MS4  Patient seen and evaluated with the above-named medical student. I agree with the plan of care. I think this patient has a soft tissue injury specifically an injury to the tensor fascia lata. I also think there is an element of greater trochanteric bursitis present. I've given her some hip abductor strengthening exercises as well as some hip external rotator exercises. She is working with a Physiological scientist and can incorporate these exercises into her workouts. I think she may continue with pain as her guide. She has not recently increased her activity so her history and her physical exam do not suggest stress fracture. I want the patient to return to the office  in one month for reevaluation. If pain persists then consider plain x-rays and possibly a greater trochanteric bursal corticosteroid injection.

## 2015-01-27 NOTE — Progress Notes (Signed)
Patient ID: Heather Kennedy, female   DOB: [birth year], 49 y.o.   MRN: [id number]  Subjective: 49 year old female presents the office today for concerns her right foot pain. She states that since she had the orthotics she continues have some discomfort to her foot, mostly along the inside part of her foot. She states the bottom of her feet are feeling better with the orthotics. She denies any recent swelling or redness. No tingling or numbness. The foot hurts the more she is on it. No recent injury or trauma. No other complaints at this time in no acute changes.   Objective: AAO 3, NAD DP/PT pulses palpable, CRT less than 3 seconds Protective sensation intact with Derrel Nip monofilament There is a decrease in medial arch upon weightbearing. There is tenderness along the course of the posterior tibial tendon inferior to the medial malleolus along the insertion into the navicular tuberosity. She is able to perform a single and also arise. There is mild discomfort on the plantar medial tubercle of the calcaneus at the insertion the plantar fascia. There is no pain along the course of the plantar fascial within the arch of the foot. The plantar fascia appears to be intact. No pain on the course the Achilles tendon. Ankle, subtalar joint range of motion is intact without any restriction or crepitation. There is no area pinpoint bony tenderness or pain the vibratory sensation. edema, erythema, increase in warmth. no open lesions or pre-ulcerative lesions. no pain with calf compression, swelling, warmth, erythema.  Assessment: 49 year old female with plantar fasciitis, posterior tibial tendinitis  Plan: -Treatment options discussed including all alternatives, risks, and complications -I discussed steroid injection to the plantar fascial however she wishes to hold off. She has an oral steroid at home from an back problem which she states that she'll take. She later call after appointment that she no  longer had the prescription-like prescription for Medrol Dosepak. This was prescribed. -Stretching/rehabilitation exercises for both plantar fasciitis and posterior tibial tendinitis was discussed. -Ice to the area. -Continue with orthotics and supportive shoe gear to all times. -Follow-up 4 weeks or sooner if any problems arise. In the meantime, encouraged to call the office with any questions, concerns, change in symptoms.   Celesta Gentile, DPM

## 2015-01-28 ENCOUNTER — Encounter: Payer: Self-pay | Admitting: Podiatry

## 2015-01-28 ENCOUNTER — Encounter: Payer: Self-pay | Admitting: Sports Medicine

## 2015-01-31 ENCOUNTER — Encounter: Payer: Self-pay | Admitting: *Deleted

## 2015-01-31 ENCOUNTER — Telehealth: Payer: Self-pay | Admitting: *Deleted

## 2015-01-31 NOTE — Telephone Encounter (Signed)
Letter written as ordered by Dr. Jacqualyn Posey.  Left message at requested phone that I would mail the note to her home.

## 2015-02-01 ENCOUNTER — Encounter: Payer: Self-pay | Admitting: *Deleted

## 2015-02-01 ENCOUNTER — Encounter: Payer: Self-pay | Admitting: Sports Medicine

## 2015-02-17 ENCOUNTER — Ambulatory Visit (INDEPENDENT_AMBULATORY_CARE_PROVIDER_SITE_OTHER): Payer: BLUE CROSS/BLUE SHIELD | Admitting: Podiatry

## 2015-02-17 ENCOUNTER — Ambulatory Visit: Payer: BLUE CROSS/BLUE SHIELD | Admitting: Podiatry

## 2015-02-17 ENCOUNTER — Encounter: Payer: Self-pay | Admitting: Podiatry

## 2015-02-17 VITALS — BP 108/71 | HR 57 | Resp 18

## 2015-02-17 DIAGNOSIS — M79671 Pain in right foot: Secondary | ICD-10-CM | POA: Diagnosis not present

## 2015-02-17 DIAGNOSIS — M722 Plantar fascial fibromatosis: Secondary | ICD-10-CM

## 2015-02-17 DIAGNOSIS — M779 Enthesopathy, unspecified: Secondary | ICD-10-CM

## 2015-02-20 ENCOUNTER — Encounter: Payer: Self-pay | Admitting: Podiatry

## 2015-02-20 NOTE — Progress Notes (Signed)
Patient ID: Heather Kennedy, female   DOB: [birth year], 49 y.o.   MRN: [id number]  Subjective: 49 year old female presents the office they for follow-up evaluation of plantar fasciitis as well as posterior tibial tendinitis. She states that she was doing well when she was taking the steroid however when she finished the pain recurred a couple days after. She also states that she feels that the inserts leaving buildup Heather Kennedy a higher arch and they're not providing the support that she needs. She denies any recent injury or trauma. This has been ongoing for approximately one year or greater. Denies swelling or redness. No other complaints at this time. No acute changes.  Objective: AAO 3, NAD DP/PT pulses palpable, CRT less than 3 seconds Protective sensation intact with Heather Kennedy monofilament There is a decrease in medial arch height upon weightbearing. There is mild tenderness to palpation along the insertion of the posterior tibial tendon into the navicular tuberosity. There is no pain posterior to the medial malleolus or an inferior. There is mild discomfort upon palpation to the plantar medial tubercle of the calcaneus at the insertion of the plantar fascia on the right foot. There is mild discomfort on medial band of the plantar fascial in the arch of the foot on the right side. No pain along the Achilles tendon. Equinus is present. Ankle, subtalar, midtarsal, MPJ range of motion is intact. No open lesions or pre-ulcerative lesions. No pain with calf compression, swelling, warmth, erythema. Upon evaluation the orthotics she still has a fault within the arch and the can be built up higher.  Assessment: 49 year old female with continued right foot pain, plantar fasciitis and posterior tibial tendinitis  Plan: After evaluation of the patient's orthotics that do believe that she would benefit from more support. These were sent back to [facility] for modifications. Continued icing, stretching,  anti-inflammatories for now. Hold off on running or high-impact activities until the pain subsides. May need to consider physical therapy in the future. Follow-up at the orthotics arrive or sooner if any problems are to arise. Call any questions or concerns in the meantime.   Heather Kennedy, DPM

## 2015-02-20 NOTE — Telephone Encounter (Signed)
I spoke with pt, she denies any calf pain, swelling or hardness, but states the back of her ankle is swollen and painful.  I told her I did send a e-mail, to follow those instructions and call for an appt tomorrow if we didn't call her first.  Marcy Siren

## 2015-02-21 ENCOUNTER — Ambulatory Visit (INDEPENDENT_AMBULATORY_CARE_PROVIDER_SITE_OTHER): Payer: BLUE CROSS/BLUE SHIELD | Admitting: Podiatry

## 2015-02-21 ENCOUNTER — Encounter: Payer: Self-pay | Admitting: Podiatry

## 2015-02-21 ENCOUNTER — Telehealth: Payer: Self-pay | Admitting: *Deleted

## 2015-02-21 VITALS — BP 125/77 | HR 81 | Resp 18

## 2015-02-21 DIAGNOSIS — R609 Edema, unspecified: Secondary | ICD-10-CM | POA: Diagnosis not present

## 2015-02-21 DIAGNOSIS — I82401 Acute embolism and thrombosis of unspecified deep veins of right lower extremity: Secondary | ICD-10-CM

## 2015-02-21 DIAGNOSIS — M779 Enthesopathy, unspecified: Secondary | ICD-10-CM

## 2015-02-21 NOTE — Telephone Encounter (Addendum)
-----   Message from Trula Slade, DPM sent at 02/21/2015  3:06 PM EDT ----- Can you order a venous duplex to rule out DVT of the right leg. She has had swelling since Saturday and calf pain. She lives in [place]. Thanks  Faxed orders and contacted pt.

## 2015-02-22 ENCOUNTER — Other Ambulatory Visit: Payer: Self-pay | Admitting: Podiatry

## 2015-02-22 ENCOUNTER — Ambulatory Visit: Payer: Self-pay | Admitting: Sports Medicine

## 2015-02-22 ENCOUNTER — Ambulatory Visit (HOSPITAL_COMMUNITY)
Admission: RE | Admit: 2015-02-22 | Discharge: 2015-02-22 | Disposition: A | Discharge status: Home / Self Care | Payer: BLUE CROSS/BLUE SHIELD | Source: Ambulatory Visit | Attending: Urology | Admitting: Urology

## 2015-02-22 DIAGNOSIS — M79604 Pain in right leg: Secondary | ICD-10-CM | POA: Insufficient documentation

## 2015-02-22 DIAGNOSIS — M7989 Other specified soft tissue disorders: Secondary | ICD-10-CM | POA: Insufficient documentation

## 2015-02-22 DIAGNOSIS — I82401 Acute embolism and thrombosis of unspecified deep veins of right lower extremity: Secondary | ICD-10-CM

## 2015-02-22 DIAGNOSIS — I10 Essential (primary) hypertension: Secondary | ICD-10-CM | POA: Insufficient documentation

## 2015-02-22 NOTE — Progress Notes (Signed)
Patient ID: CAMBER NINH, female   DOB: 08/14/1965, 49 y.o.   MRN: [id number]  Subjective: 49 year old female presents the office today for concerns of right foot and ankle swelling and pain which started on Saturday morning. She states that she wore a new brace has her orthotics were sent back. She states that she has had pain to her ankle into her leg and Saturday. She denies any warmth or redness. No recent injury or trauma. Denies any systemic complaints such as fevers, chills, nausea, vomiting. No acute changes since last appointment, and no other complaints at this time.   Objective: AAO x3, NAD DP/PT pulses palpable bilaterally, CRT less than 3 seconds Protective sensation intact with Simms Weinstein monofilament, vibratory sensation intact, Achilles tendon reflex intact There is tenderness to palpation along the medial aspect of the ankle on the course of the flexor tendons. There is discomfort on the insertion of the posterior tibial tendon into the navicular tuberosity. There is a decrease in medial arch height. There is edema to the ankle and distal leg without any associated erythema, increase in warmth. there is no open lesions or pre-ulcerative lesions. No fluctuance or crepitus. No induration.  No areas of pinpoint bony tenderness or pain with vibratory sensation. MMT 5/5, ROM WNL. No edema, erythema, increase in warmth to bilateral lower extremities.  No open lesions or pre-ulcerative lesions.  No pain with calf compression, swelling, warmth, erythema  Assessment: 49 year old female with increased right ankle/leg pain since Saturday  Plan: -All treatment options discussed with the patient including all alternatives, risks, complications.  -Venous duplex to r/o DVT -Ankle brace -Compression sock -Ice/elevation -NSAIDs as needed -Follow-up with me once orthotics return or sooner if any problems arise. In the meantime, encouraged to call the office with any questions, concerns,  change in symptoms.   Celesta Gentile, DPM

## 2015-02-23 ENCOUNTER — Encounter: Payer: Self-pay | Admitting: Sports Medicine

## 2015-02-23 ENCOUNTER — Ambulatory Visit (INDEPENDENT_AMBULATORY_CARE_PROVIDER_SITE_OTHER): Payer: BLUE CROSS/BLUE SHIELD | Admitting: Sports Medicine

## 2015-02-23 VITALS — BP 130/92 | Ht 69.0 in | Wt 207.0 lb

## 2015-02-23 DIAGNOSIS — M25552 Pain in left hip: Secondary | ICD-10-CM | POA: Diagnosis not present

## 2015-02-23 DIAGNOSIS — M5416 Radiculopathy, lumbar region: Secondary | ICD-10-CM

## 2015-02-23 MED ORDER — GABAPENTIN 300 MG PO CAPS: 300.0000 mg | ORAL_CAPSULE | Freq: Every day | ORAL | Status: DC

## 2015-02-23 NOTE — Progress Notes (Signed)
Subjective:    Patient ID: Heather Kennedy, female    DOB: 15-Jan-1966, 49 y.o.   MRN: [id number]  HPI  Patient comes in today for follow-up on left hip pain. She is still experiencing pain along the posterior lateral hip area. Pain has begun to radiate down the lateral thigh into the lower leg and lateral aspect of her left foot. She describes it as a burning type of pain with some general achiness along the lateral aspect of her left thigh. Symptoms are intermittent but can be quite debilitating. She has difficulty sleeping due to her pain. She denies any weakness. Symptoms are more noticeable with prolonged sitting. Do improve somewhat standing. Her symptoms are somewhat similar to sciatica which she experienced last year after an injury. X-rays and MRI were done and per the patient's report the MRI showed some degenerative disc disease but no discrete disc herniation. She went to physical therapy for a brief period of time and does admit that her symptoms improved but never completely resolved. She denies groin pain. She has been trying to do her home exercises but finds them painful. She feels like all of the pain on the left side is compensatory from a right lower leg injury which is being treated by another physician. Currently she is awaiting new orthotics for her shoes. She takes an occasional Motrin as well as a tramadol. 800 mg of Motrin in the morning but higher doses causes swelling. She takes tramadol sparingly as well. This does seem to help though when she takes it.    Review of Systems     Objective:   Physical Exam Well-developed, well-nourished. No acute distress  Lumbar spine: Full lumbar range of motion. There is some pain with extension. Palpable tenderness. Negative straight leg raise. Her strength is 5/5 with reflexes trace but equal at the Achilles and patellar tendons bilaterally. There is some slight decreased sensation to light touch along the S1 dermatome along the  lateral aspect of the left foot compared to the right foot. Good dorsalis pedis and posterior tibial pulses. In relating without a limp.  Left hip: Smooth painless hip range of motion with a negative log roll. She does have some mild diffuse tenderness along the lateral hip but nothing focal. Minimal tenderness over the greater trochanteric bursa.  X-rays of her lumbar spine from July 15 are available for review. They're unremarkable. MRI of her lumbar spine was done at [facility] and is not available for review.       Assessment & Plan:  Left hip and leg pain likely secondary to lumbar radiculopathy  I think her symptoms are compensatory and related to whatever issue she is dealing with in her right ankle and foot. She was placed on a 12 day prednisone Dosepak previously for her right foot and ankle and that resolved all of her pain including her left hip pain. However, symptoms began to return shortly after completing the [place]. I would like for the patient to start some formal physical therapy with Barbaraann Barthel and we will try some Neurontin at night (300 mg daily at bedtime). I would like to request the MRI report from [facility] of her lumbar spine and have her follow-up with me in 4 weeks. I did discuss the possibility of a new lumbar MRI if symptoms persist or worsen. We also discussed the possibility of a lumbar epidural steroidal injection but it sounds like she has not had good success with cortisone injections in  other parts of her body in the past. I think she can continue with activity using pain as her guide. If her right lower leg pain does not resolve with her new orthotics and I think we should look into this further as well.

## 2015-02-24 ENCOUNTER — Ambulatory Visit (INDEPENDENT_AMBULATORY_CARE_PROVIDER_SITE_OTHER): Payer: BLUE CROSS/BLUE SHIELD | Admitting: Podiatry

## 2015-02-24 DIAGNOSIS — M779 Enthesopathy, unspecified: Secondary | ICD-10-CM

## 2015-02-25 ENCOUNTER — Encounter: Payer: Self-pay | Admitting: Podiatry

## 2015-02-27 NOTE — Progress Notes (Signed)
Patient ID: Heather Kennedy, female   DOB: 25-Dec-1965, 49 y.o.   MRN: [id number]  Patient presented to the office today for pickup orthotics and they were sent out modifications. However when she arrived the dress orthotics that she had ordered had arrived and not her modified orthotics. She states that she did not want the dress orthotics and only orthotics that were sent back for modifications. She was seen by one of the MAs. She was suppose to see me today as well to recheck the swelling and pain in her foot/leg but she did not stay for me to evaluate her. Will follow-up with her once her new orthotics come in.

## 2015-03-01 ENCOUNTER — Telehealth: Payer: Self-pay | Admitting: *Deleted

## 2015-03-01 NOTE — Telephone Encounter (Addendum)
-----   Message from Trula Slade, DPM sent at 03/01/2015  4:06 PM EDT ----- I was going to see her when she came in for her orthotics. It was negative for DVT. Was she calling? ----- Message -----    From: Andres Ege, RN    Sent: 03/01/2015  11:16 AM      To: Trula Slade, DPM  Dr. Jacqualyn Posey, pt's doppler results are in, please advise.  Geniece Akers Left message I will contact by Email.

## 2015-03-06 ENCOUNTER — Ambulatory Visit: Payer: BLUE CROSS/BLUE SHIELD | Admitting: Podiatry

## 2015-03-07 ENCOUNTER — Encounter: Payer: Self-pay | Admitting: Podiatry

## 2015-03-07 ENCOUNTER — Ambulatory Visit (INDEPENDENT_AMBULATORY_CARE_PROVIDER_SITE_OTHER): Payer: BLUE CROSS/BLUE SHIELD | Admitting: Podiatry

## 2015-03-07 DIAGNOSIS — M79671 Pain in right foot: Secondary | ICD-10-CM

## 2015-03-07 DIAGNOSIS — M779 Enthesopathy, unspecified: Secondary | ICD-10-CM | POA: Diagnosis not present

## 2015-03-09 NOTE — Progress Notes (Signed)
LMP 10/12/2012   Subjective:    Patient ID: Heather Kennedy, female    DOB: 1966/03/19, 49 y.o.   MRN: [id number]  HPI: TAMERRA Kennedy is a 49 y.o. female presenting on 03/07/2015 for follow-up evaluation of right ankle pain as well as swelling. She is also here to pick up orthotics which were adjusted. She states that after the ultrasound, which was negative for DVT, was obtained the swelling went down that night. She states that she continues to get pain on the inside portion of her ankle. This pain has been ongoing for greater than 1 year and she does not have much relief. She did state that she started in physical therapy today for an unrelated issue and she discussed the foot with them as well. She denies any swelling or redness at this time. She denies any recent injury or trauma. No other complaints at this time in no acute changes otherwise.  Relevant past medical, surgical, family and social history reviewed and updated as indicated. Interim medical history since our last visit reviewed. Allergies and medications reviewed and updated.  Current Outpatient Prescriptions on File Prior to Visit  Medication Sig  . gabapentin (NEURONTIN) 300 MG capsule Take 1 capsule (300 mg total) by mouth at bedtime.  . hydrochlorothiazide (HYDRODIURIL) 25 MG tablet Take 25 mg by mouth daily.  . hydrochlorothiazide (MICROZIDE) 12.5 MG capsule Take 12.5 mg by mouth.  . methylPREDNISolone (MEDROL DOSEPAK) 4 MG TBPK tablet Take as directed  . predniSONE (STERAPRED UNI-PAK 48 TAB) 10 MG (48) TBPK tablet TAKE TABLETS BY MOUTH IN TAPERED DOSES AS DIRECTED IN DOSE PACK  . traMADol (ULTRAM) 50 MG tablet take 1 tablet by mouth every 6 to 8 hours if needed for pain   Current Facility-Administered Medications on File Prior to Visit  Medication  . triamcinolone acetonide (KENALOG) 10 MG/ML injection 10 mg    Review of systems:  Per HPI unless specifically indicated above     Objective:    LMP 10/12/2012    Wt Readings from Last 3 Encounters:  02/23/15 207 lb (93.895 kg)  01/24/15 210 lb (95.255 kg)  04/02/12 190 lb (86.183 kg)    Physical Exam General: AAO x3, NAD  Dermatological: Skin is warm, dry and supple bilateral. Nails x 10 are well manicured; remaining integument appears unremarkable at this time. There are no open sores, no preulcerative lesions, no rash or signs of infection present.  Vascular: Dorsalis Pedis artery and Posterior Tibial artery pedal pulses are 2/4 bilateral with immedate capillary fill time. Pedal hair growth present. No varicosities and no lower extremity edema present bilateral. There is no pain with calf compression, swelling, warmth, erythema.   Neruologic: Grossly intact via light touch bilateral. Vibratory intact via tuning fork bilateral. Protective threshold with Semmes Wienstein monofilament intact to all pedal sites bilateral. Patellar and Achilles deep tendon reflexes 2+ bilateral. No Babinski or clonus noted bilateral.   Musculoskeletal: There is continued tenderness on medial aspect of the ankle along the course of the posterior tibial tendon into a lesser degree the other flexor tendons. There is mild discomfort along the insertion of the posterior tibial tendon into the navicular. There is no overlying edema, erythema, increase in warmth. There is no areas of pinpoint bony tenderness. Vibratory sensation bilateral lower extremities.  Gait: Unassisted, Nonantalgic.   No results found for this or any previous visit.    Assessment & Plan:  49 year old female with continuation of right ankle pain, likely tendinitis. -  Treatment options discussed including all alternatives, risks, and complications -I discussed with her possible MRI of the symptoms have been ongoing for greater than one year without much relief. She wishes to hold off on that at this time. -She is are recorded physical therapy I did recommend physical therapy for the foot as well. A  prescription was given to her. I like for them to include iontophoresis. -The new orthotics which are modified were dispensed to her. Again is a problems with them to call the office. -Follow-up me after physical therapy or sooner if any problems are to arise. Call any questions or concerns in the meantime.  Celesta Gentile, DPM

## 2015-03-22 ENCOUNTER — Encounter: Payer: Self-pay | Admitting: Sports Medicine

## 2015-03-22 ENCOUNTER — Ambulatory Visit (INDEPENDENT_AMBULATORY_CARE_PROVIDER_SITE_OTHER): Payer: BLUE CROSS/BLUE SHIELD | Admitting: Sports Medicine

## 2015-03-22 VITALS — BP 131/80 | HR 67 | Ht 69.0 in | Wt 207.0 lb

## 2015-03-22 DIAGNOSIS — M7521 Bicipital tendinitis, right shoulder: Secondary | ICD-10-CM

## 2015-03-22 DIAGNOSIS — M25552 Pain in left hip: Secondary | ICD-10-CM

## 2015-03-22 NOTE — Progress Notes (Signed)
   Subjective:    Patient ID: Heather Kennedy, female    DOB: 07/24/1965, 49 y.o.   MRN: [id number]  HPI   Patient comes in today for follow-up on left hip pain. Pain has improved dramatically with physical therapy. She has been working with Vivi Ferns and getting good results. I was able to obtain and review the records from [facility] including the MRI report of her lumbar spine. MRI was done 1 year ago and showed only some minimal degenerative changes. She is complaining today of some anterior right shoulder pain. It has been intermittent for quite some time but over the past couple of weeks has become more noticeable. She has been doing more upper body work in [employer] and notices it with her exercises. She localizes her pain to the bicipital groove. No radiating pain. No popping or catching. No swelling.    Review of Systems    as above Objective:   Physical Exam Well-developed, well-nourished. No acute distress. Awake alert and oriented 3.  Left hip: Smooth painless hip range of motion with a negative log roll. No palpable tenderness. Good strength. No focal neurological deficit distally.  Right shoulder: There is tenderness to palpation of the long head of the biceps tendon in the bicipital groove. No appreciable subluxation. Full shoulder range of motion. No signs of impingement. No tenderness over the before meals joint. Neurovascularly intact distally.       Assessment & Plan:  Improving left hip pain Right shoulder pain secondary to proximal biceps tendinitis  Think the patient would benefit from a few more sessions of physical therapy. Her hip is feeling better so I would like for them to focus on her right biceps tendon. I think she should take a short break from upper body resisted straining until her symptoms subside. I've reassured her that the MRI of her lumbar spine done 1 year ago showed only some minimal degenerative changes. She can transition from  formal therapy to a home exercise program per the therapist's discretion and she will follow-up with me as needed.

## 2015-06-21 ENCOUNTER — Ambulatory Visit: Payer: BLUE CROSS/BLUE SHIELD | Admitting: *Deleted

## 2015-06-21 DIAGNOSIS — M722 Plantar fascial fibromatosis: Secondary | ICD-10-CM

## 2015-06-21 NOTE — Progress Notes (Signed)
Patient ID: Heather Kennedy, female   DOB: 1965/09/05, 50 y.o.   MRN: [id number] Patient presents with her orthotics stating that she couldn't wear them so she had some made else wear.  She is requesting we sent the orthotics back and have the remade like her new pair so that she can wear both pairs.  We will send pictures to manufacturer with the orthotics to have adjustments made.  Change for medium width to wide and add med met pad.

## 2015-07-04 ENCOUNTER — Encounter: Payer: Self-pay | Admitting: Sports Medicine

## 2015-07-04 ENCOUNTER — Other Ambulatory Visit: Payer: Self-pay | Admitting: *Deleted

## 2015-07-04 MED ORDER — GABAPENTIN 300 MG PO CAPS: 300.0000 mg | ORAL_CAPSULE | Freq: Two times a day (BID) | ORAL | Status: DC

## 2015-07-11 ENCOUNTER — Encounter: Payer: Self-pay | Admitting: Podiatry

## 2015-08-22 ENCOUNTER — Other Ambulatory Visit: Payer: Self-pay

## 2015-08-22 DIAGNOSIS — Z1231 Encounter for screening mammogram for malignant neoplasm of breast: Secondary | ICD-10-CM

## 2015-08-24 ENCOUNTER — Ambulatory Visit (INDEPENDENT_AMBULATORY_CARE_PROVIDER_SITE_OTHER): Payer: BLUE CROSS/BLUE SHIELD | Admitting: Sports Medicine

## 2015-08-24 ENCOUNTER — Ambulatory Visit
Admission: RE | Admit: 2015-08-24 | Discharge: 2015-08-24 | Disposition: A | Discharge status: Home / Self Care | Payer: BLUE CROSS/BLUE SHIELD | Source: Ambulatory Visit | Attending: Sports Medicine | Admitting: Sports Medicine

## 2015-08-24 ENCOUNTER — Encounter: Payer: Self-pay | Admitting: Sports Medicine

## 2015-08-24 VITALS — BP 123/90 | HR 67 | Ht 68.0 in | Wt 196.0 lb

## 2015-08-24 DIAGNOSIS — M25511 Pain in right shoulder: Secondary | ICD-10-CM | POA: Diagnosis not present

## 2015-08-25 NOTE — Progress Notes (Signed)
   Subjective:    Patient ID: Heather Kennedy, female    DOB: 25-Jun-1965, 50 y.o.   MRN: [id number]  HPI chief complaint: Right shoulder pain  Patient comes in today with persistent right shoulder pain. She was last seen in the office back in October with this exact same complaint. She has been diligently doing a home exercise program an effort to get better. Unfortunately, she continues to have pain that she localizes primarily to the anterior shoulder. Was initially present only with exercise but is now present with activities of daily living. It is a burning type pain but does not radiate. It is associated with shoulder motion. No neck pain. She was on 300 mg of gabapentin twice a day which was initially helpful but caused swelling and weight gain. She has since discontinued that medicine. Anti-inflammatories also caused swelling. She denies any recent trauma. No mechanical symptoms. Pain does awaken her at night.  Interim medical history is reviewed Medications reviewed Allergies reviewed    Review of Systems    as above Objective:   Physical Exam  Well-developed, well-nourished. No acute distress. Awake alert and oriented 3. Vital signs reviewed  Right shoulder: Full range of motion with a positive painful ARC. No tenderness to palpation along the clavicle or over the acromioclavicular joint. No tenderness at the bicipital groove. Positive empty can, positive Hawkins. Rotator cuff strength is 5/5 bilaterally but is reproducible pain with resisted supraspinatus and external rotation on the right. Positive O'Brien's. Negative clunk. Negative apprehension. Neurovascularly intact distally.  X-rays of the right shoulder including an outlet view were obtained. Patient has spurring at the [place] joint. No glenohumeral DJD. Nothing acute.        Assessment & Plan:   Right shoulder pain worrisome for rotator cuff tear versus possible labral tear  MRI of the right shoulder specifically to  rule out a rotator cuff tear versus a labral tear. I will call the patient with those results once available at which point we'll delineate a more definitive treatment plan. In the meantime, patient will limit her activities to those exercises which are not painful.

## 2015-08-31 ENCOUNTER — Ambulatory Visit
Admission: RE | Admit: 2015-08-31 | Discharge: 2015-08-31 | Disposition: A | Discharge status: Home / Self Care | Payer: BLUE CROSS/BLUE SHIELD | Source: Ambulatory Visit | Attending: Sports Medicine | Admitting: Sports Medicine

## 2015-08-31 DIAGNOSIS — M25511 Pain in right shoulder: Secondary | ICD-10-CM

## 2015-09-01 ENCOUNTER — Telehealth: Payer: Self-pay | Admitting: Sports Medicine

## 2015-09-01 NOTE — Telephone Encounter (Signed)
Dr Merrily Pew [facility] Monday April 10th at 1015am [address] [place] [address] [phone]

## 2015-09-01 NOTE — Telephone Encounter (Signed)
I spoke with the patient on the phone today after reviewing the MRI of her right shoulder. 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 She also has some mild fatty atrophy of the teres minor which can be seen in quadrilateral space syndrome. With her main complaint being a burning type pain that did improve initially with gabapentin, I wonder whether or not it is in fact quadrilateral space syndrome that is causing her pain and not her rotator cuff. She's failed conservative treatment to date for rotator cuff tendinopathy. Therefore, I would like to refer her to Dr. Mardelle Matte specifically for his opinion to help differentiate between rotator cuff tendinopathy and quadrilateral space syndrome. I'll defer further workup and treatment to the discretion of Dr. Mardelle Matte. Patient will follow-up with me as needed.

## 2015-09-11 ENCOUNTER — Ambulatory Visit: Payer: Self-pay | Admitting: Sports Medicine

## 2015-09-11 ENCOUNTER — Encounter: Payer: Self-pay | Admitting: Sports Medicine

## 2015-10-04 ENCOUNTER — Ambulatory Visit
Admission: RE | Admit: 2015-10-04 | Discharge: 2015-10-04 | Disposition: A | Discharge status: Home / Self Care | Payer: BLUE CROSS/BLUE SHIELD | Source: Ambulatory Visit

## 2015-10-04 DIAGNOSIS — Z1231 Encounter for screening mammogram for malignant neoplasm of breast: Secondary | ICD-10-CM

## 2016-08-22 ENCOUNTER — Other Ambulatory Visit: Payer: Self-pay | Admitting: Obstetrics and Gynecology

## 2016-08-22 DIAGNOSIS — Z1231 Encounter for screening mammogram for malignant neoplasm of breast: Secondary | ICD-10-CM

## 2016-10-04 ENCOUNTER — Ambulatory Visit
Admission: RE | Admit: 2016-10-04 | Discharge: 2016-10-04 | Disposition: A | Discharge status: Home / Self Care | Payer: BLUE CROSS/BLUE SHIELD | Source: Ambulatory Visit | Attending: Obstetrics and Gynecology | Admitting: Obstetrics and Gynecology

## 2016-10-04 DIAGNOSIS — Z1231 Encounter for screening mammogram for malignant neoplasm of breast: Secondary | ICD-10-CM

## 2017-09-22 ENCOUNTER — Other Ambulatory Visit: Payer: Self-pay | Admitting: Family Medicine

## 2017-09-22 DIAGNOSIS — Z1231 Encounter for screening mammogram for malignant neoplasm of breast: Secondary | ICD-10-CM

## 2017-10-10 ENCOUNTER — Ambulatory Visit
Admission: RE | Admit: 2017-10-10 | Discharge: 2017-10-10 | Disposition: A | Discharge status: Home / Self Care | Payer: PRIVATE HEALTH INSURANCE | Source: Ambulatory Visit | Attending: Family Medicine | Admitting: Family Medicine

## 2017-10-10 ENCOUNTER — Ambulatory Visit: Payer: Self-pay

## 2017-10-10 DIAGNOSIS — Z1231 Encounter for screening mammogram for malignant neoplasm of breast: Secondary | ICD-10-CM

## 2018-05-08 ENCOUNTER — Other Ambulatory Visit (HOSPITAL_COMMUNITY): Payer: Self-pay | Admitting: Family Medicine

## 2018-05-08 DIAGNOSIS — R609 Edema, unspecified: Secondary | ICD-10-CM

## 2018-05-11 ENCOUNTER — Ambulatory Visit (HOSPITAL_COMMUNITY): Payer: PRIVATE HEALTH INSURANCE | Attending: Family Medicine

## 2018-05-11 ENCOUNTER — Other Ambulatory Visit: Payer: Self-pay

## 2018-05-11 DIAGNOSIS — R609 Edema, unspecified: Secondary | ICD-10-CM | POA: Insufficient documentation

## 2018-10-23 ENCOUNTER — Other Ambulatory Visit: Payer: Self-pay | Admitting: Family Medicine

## 2018-10-23 DIAGNOSIS — Z1231 Encounter for screening mammogram for malignant neoplasm of breast: Secondary | ICD-10-CM

## 2018-10-29 ENCOUNTER — Ambulatory Visit
Admission: RE | Admit: 2018-10-29 | Discharge: 2018-10-29 | Disposition: A | Discharge status: Home / Self Care | Payer: PRIVATE HEALTH INSURANCE | Source: Ambulatory Visit | Attending: Family Medicine | Admitting: Family Medicine

## 2018-10-29 ENCOUNTER — Other Ambulatory Visit: Payer: Self-pay

## 2018-10-29 DIAGNOSIS — Z1231 Encounter for screening mammogram for malignant neoplasm of breast: Secondary | ICD-10-CM

## 2019-09-28 ENCOUNTER — Other Ambulatory Visit: Payer: Self-pay | Admitting: Family Medicine

## 2019-09-28 DIAGNOSIS — Z1231 Encounter for screening mammogram for malignant neoplasm of breast: Secondary | ICD-10-CM

## 2019-11-02 ENCOUNTER — Ambulatory Visit
Admission: RE | Admit: 2019-11-02 | Discharge: 2019-11-02 | Disposition: A | Discharge status: Home / Self Care | Payer: PRIVATE HEALTH INSURANCE | Source: Ambulatory Visit | Attending: Family Medicine | Admitting: Family Medicine

## 2019-11-02 ENCOUNTER — Other Ambulatory Visit: Payer: Self-pay

## 2019-11-02 DIAGNOSIS — Z1231 Encounter for screening mammogram for malignant neoplasm of breast: Secondary | ICD-10-CM

## 2020-06-20 ENCOUNTER — Other Ambulatory Visit: Payer: PRIVATE HEALTH INSURANCE

## 2020-06-20 DIAGNOSIS — Z20822 Contact with and (suspected) exposure to covid-19: Secondary | ICD-10-CM

## 2020-06-22 LAB — SARS-COV-2, NAA 2 DAY TAT

## 2020-06-22 LAB — NOVEL CORONAVIRUS, NAA: SARS-CoV-2, NAA: NOT DETECTED

## 2020-08-02 ENCOUNTER — Ambulatory Visit (INDEPENDENT_AMBULATORY_CARE_PROVIDER_SITE_OTHER): Payer: PRIVATE HEALTH INSURANCE | Admitting: Family Medicine

## 2020-08-02 ENCOUNTER — Other Ambulatory Visit: Payer: Self-pay

## 2020-08-02 ENCOUNTER — Encounter: Payer: Self-pay | Admitting: Family Medicine

## 2020-08-02 VITALS — BP 122/78 | Ht 67.0 in | Wt 207.0 lb

## 2020-08-02 DIAGNOSIS — Z780 Asymptomatic menopausal state: Secondary | ICD-10-CM | POA: Insufficient documentation

## 2020-08-02 DIAGNOSIS — J45990 Exercise induced bronchospasm: Secondary | ICD-10-CM | POA: Insufficient documentation

## 2020-08-02 DIAGNOSIS — I10 Essential (primary) hypertension: Secondary | ICD-10-CM | POA: Insufficient documentation

## 2020-08-02 DIAGNOSIS — M25561 Pain in right knee: Secondary | ICD-10-CM

## 2020-08-02 NOTE — Patient Instructions (Signed)
Your exam and ultrasound are reassuring. You strained your distal IT band. I'd expect this to take another 1-2 weeks to resolve. Activities as tolerated, advance as pain allows. Hip abduction strengthening, IT band stretches for next month as directed. Icing 15 minutes at a time as needed. Tylenol and/or motrin as needed for pain. Follow up with [place] as needed.

## 2020-08-02 NOTE — Progress Notes (Signed)
PCP: Kelton Pillar, MD  Subjective:   HPI: Patient is a 55 y.o. female here for right knee pain.  Patient reports 1 week ago while on a spin bike she felt like she tweaked lateral aspect of her right knee. Thinks maybe she came unclipped and this led to the issue with her knee. Since then started having pain especially walking uphill, using stairs. Tried motrin, tylenol which help some as well as topical medications. Past day biking has been better but has to ensure she's pedaling without foot in plantarflexed position.  History reviewed. No pertinent past medical history.  Current Outpatient Medications on File Prior to Visit  Medication Sig Dispense Refill  . hydrochlorothiazide (HYDRODIURIL) 25 MG tablet Take 25 mg by mouth daily.     No current facility-administered medications on file prior to visit.    Past Surgical History:  Procedure Laterality Date  . BREAST BIOPSY Right 2002   benign    Allergies  Allergen Reactions  . Iodine Anaphylaxis  . Shellfish Allergy Anaphylaxis  . Amoxicillin-Pot Clavulanate Hives  . Keflex [Cephalexin] Hives  . Ampicillin   . Gabapentin Other (See Comments)  . Other Other (See Comments)    Social History   Socioeconomic History  . Marital status: Married    Spouse name: Not on file  . Number of children: Not on file  . Years of education: Not on file  . Highest education level: Not on file  Occupational History  . Not on file  Tobacco Use  . Smoking status: Never Smoker  . Smokeless tobacco: Never Used  Substance and Sexual Activity  . Alcohol use: Not on file  . Drug use: Not on file  . Sexual activity: Not on file  Other Topics Concern  . Not on file  Social History Narrative  . Not on file   Social Determinants of Health   Financial Resource Strain: Not on file  Food Insecurity: Not on file  Transportation Needs: Not on file  Physical Activity: Not on file  Stress: Not on file  Social Connections: Not on  file  Intimate Partner Violence: Not on file    History reviewed. No pertinent family history.  BP 122/78   Ht 5\' 7"  (1.702 m)   Wt 207 lb (93.9 kg)   LMP 10/12/2012   BMI 32.42 kg/m   No flowsheet data found.  No flowsheet data found.  Review of Systems: See HPI above.     Objective:  Physical Exam:  Gen: NAD, comfortable in exam room  Right knee: No gross deformity, ecchymoses, effusion. Mild TTP distal IT band.  No joint line tenderness. FROM with normal strength. Negative ant/post drawers. Negative valgus/varus testing. Negative lachman. Negative mcmurrays, apleys, thessaly NV intact distally. Negative ober, noble.   Limited MSK u/s right knee: no effusion.  No abnormalities of menisci.  Assessment & Plan:  1. Right knee pain - 2/2 distal IT band strain.  Exam and ultrasound reassuring.  Home exercises and stretches reviewed.  Icing, tylenol, motrin.  Activities as tolerated.  F/u prn.

## 2020-09-20 ENCOUNTER — Other Ambulatory Visit: Payer: Self-pay | Admitting: Family Medicine

## 2020-09-20 DIAGNOSIS — Z1231 Encounter for screening mammogram for malignant neoplasm of breast: Secondary | ICD-10-CM

## 2020-11-09 ENCOUNTER — Other Ambulatory Visit: Payer: Self-pay

## 2020-11-09 ENCOUNTER — Ambulatory Visit
Admission: RE | Admit: 2020-11-09 | Discharge: 2020-11-09 | Disposition: A | Discharge status: Home / Self Care | Payer: PRIVATE HEALTH INSURANCE | Source: Ambulatory Visit

## 2020-11-09 DIAGNOSIS — Z1231 Encounter for screening mammogram for malignant neoplasm of breast: Secondary | ICD-10-CM

## 2021-11-07 DIAGNOSIS — Z1231 Encounter for screening mammogram for malignant neoplasm of breast: Secondary | ICD-10-CM

## 2021-11-09 ENCOUNTER — Other Ambulatory Visit: Payer: Self-pay | Admitting: Family Medicine

## 2021-11-09 DIAGNOSIS — Z1231 Encounter for screening mammogram for malignant neoplasm of breast: Secondary | ICD-10-CM

## 2021-11-13 ENCOUNTER — Ambulatory Visit
Admission: RE | Admit: 2021-11-13 | Discharge: 2021-11-13 | Disposition: A | Discharge status: Home / Self Care | Payer: No Typology Code available for payment source | Source: Ambulatory Visit

## 2021-11-13 DIAGNOSIS — Z1231 Encounter for screening mammogram for malignant neoplasm of breast: Secondary | ICD-10-CM

## 2021-11-15 ENCOUNTER — Other Ambulatory Visit: Payer: Self-pay | Admitting: Family Medicine

## 2021-11-15 DIAGNOSIS — R928 Other abnormal and inconclusive findings on diagnostic imaging of breast: Secondary | ICD-10-CM

## 2021-11-26 ENCOUNTER — Other Ambulatory Visit: Payer: No Typology Code available for payment source

## 2021-12-04 ENCOUNTER — Other Ambulatory Visit: Payer: Self-pay | Admitting: Family Medicine

## 2021-12-04 ENCOUNTER — Ambulatory Visit
Admission: RE | Admit: 2021-12-04 | Discharge: 2021-12-04 | Disposition: A | Discharge status: Home / Self Care | Payer: No Typology Code available for payment source | Source: Ambulatory Visit | Attending: Family Medicine | Admitting: Family Medicine

## 2021-12-04 DIAGNOSIS — N6320 Unspecified lump in the left breast, unspecified quadrant: Secondary | ICD-10-CM

## 2021-12-04 DIAGNOSIS — R928 Other abnormal and inconclusive findings on diagnostic imaging of breast: Secondary | ICD-10-CM

## 2022-06-06 ENCOUNTER — Other Ambulatory Visit: Payer: No Typology Code available for payment source

## 2022-06-11 ENCOUNTER — Other Ambulatory Visit: Payer: Self-pay | Admitting: Family Medicine

## 2022-06-11 ENCOUNTER — Ambulatory Visit
Admission: RE | Admit: 2022-06-11 | Discharge: 2022-06-11 | Disposition: A | Discharge status: Home / Self Care | Payer: No Typology Code available for payment source | Source: Ambulatory Visit | Attending: Family Medicine | Admitting: Family Medicine

## 2022-06-11 DIAGNOSIS — N6320 Unspecified lump in the left breast, unspecified quadrant: Secondary | ICD-10-CM

## 2022-08-21 IMAGING — MG MM DIGITAL SCREENING BILAT W/ TOMO AND CAD
8 series · 8 of 24 positions shown · non-contrast
Comparison: Previous exam(s).

CLINICAL DATA: Screening.

EXAM:
DIGITAL SCREENING BILATERAL MAMMOGRAM WITH TOMOSYNTHESIS AND CAD
TECHNIQUE: Bilateral screening digital craniocaudal and mediolateral oblique
mammograms were obtained. Bilateral screening digital breast
tomosynthesis was performed. The images were evaluated with
computer-aided detection.

[R CC synth-2D]
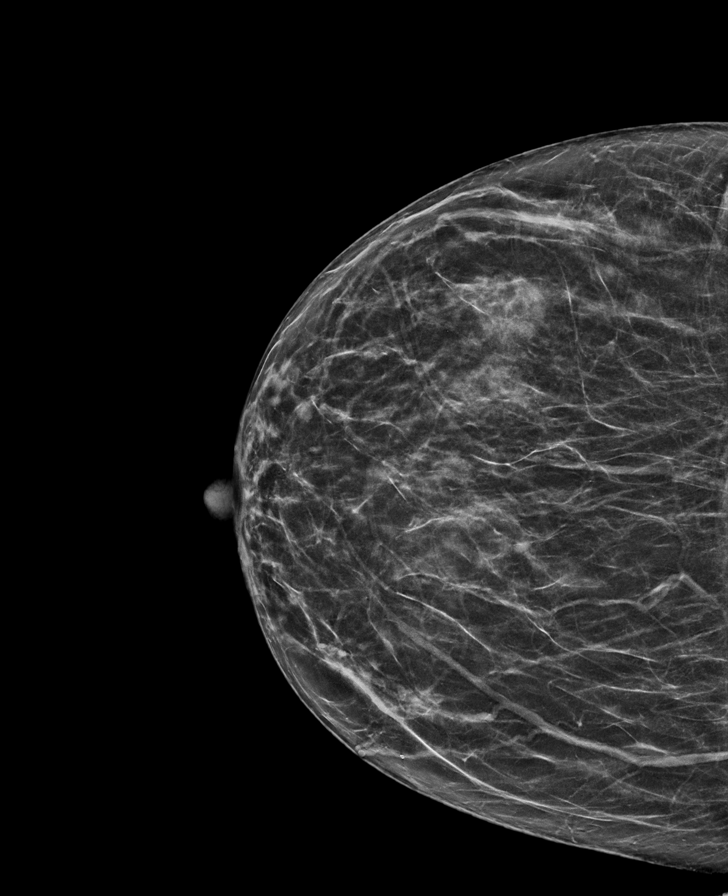

[L MLO synth-2D]
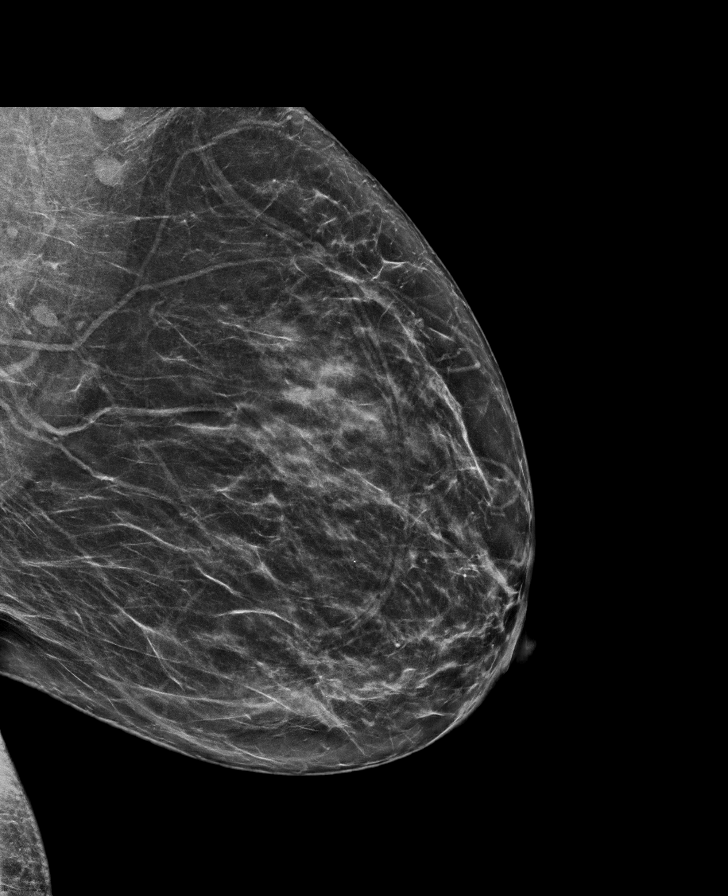

[R MLO synth-2D]
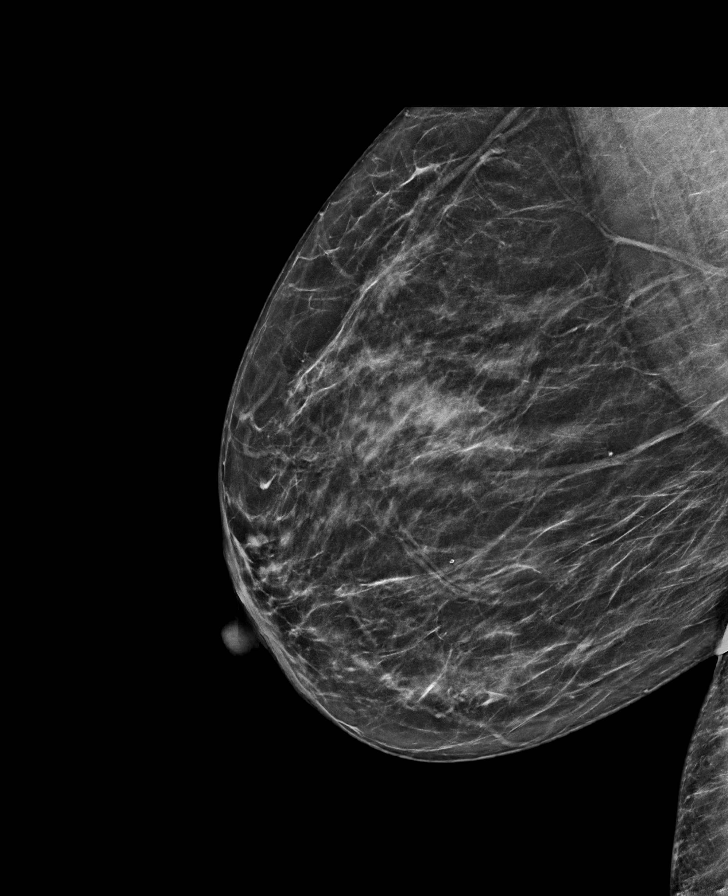

[L CC synth-2D]
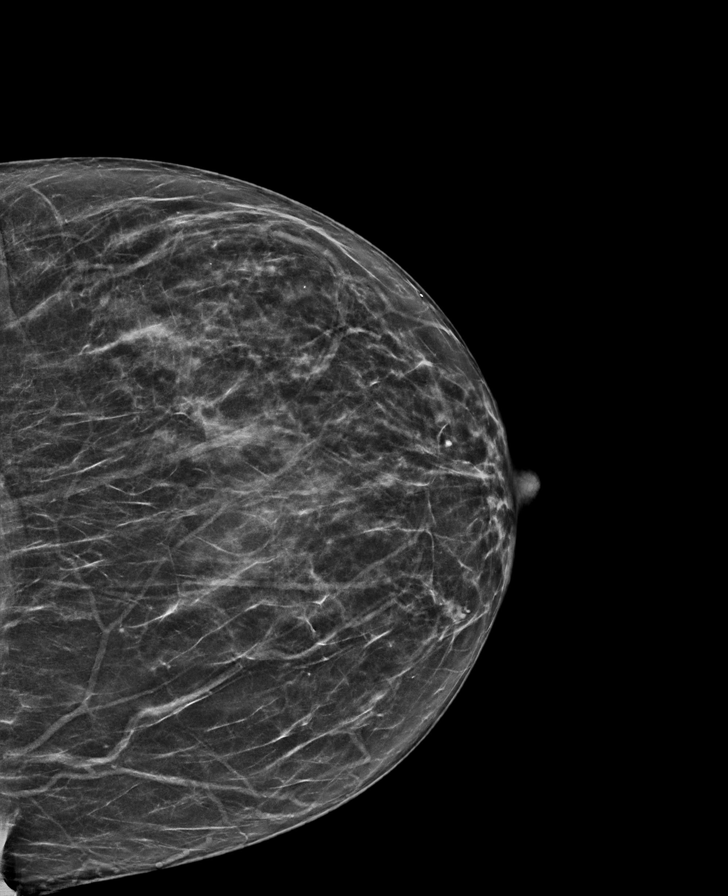

[R MLO tomo · tomo slice 34/67.0]
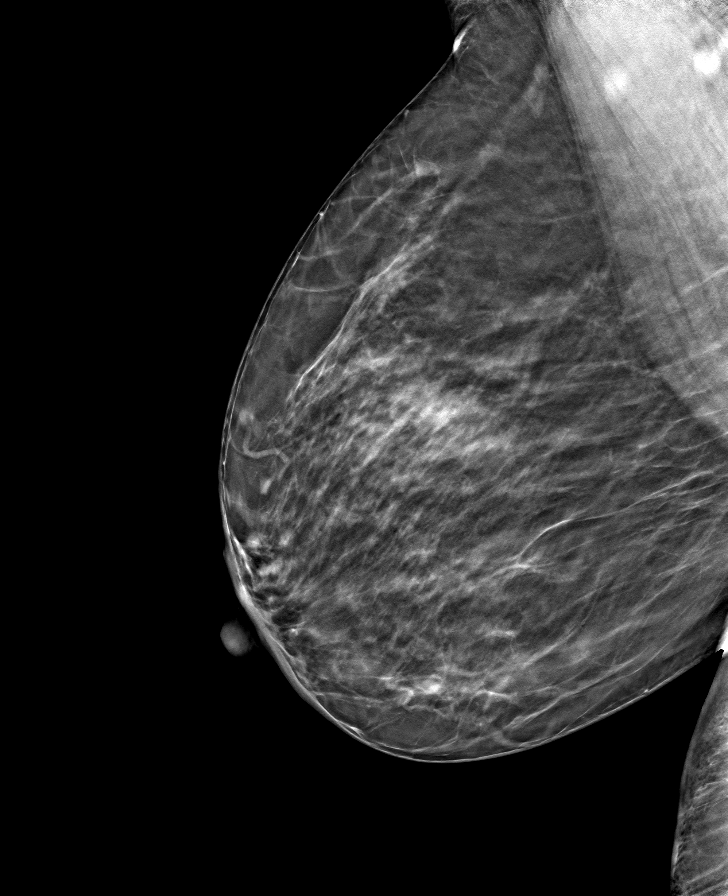

[L MLO tomo · tomo slice 36/71.0]
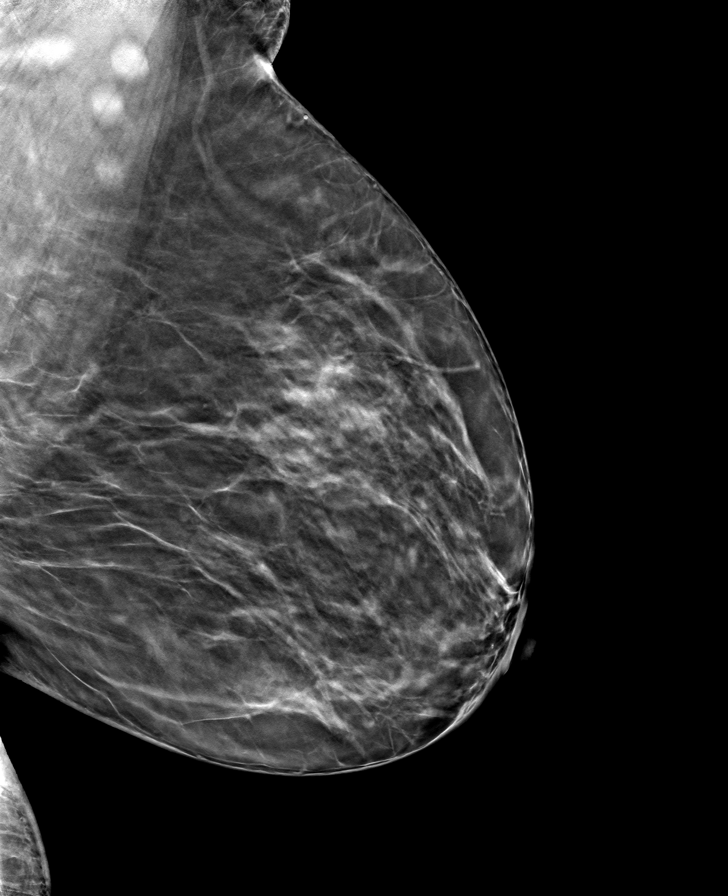

[R CC tomo · tomo slice 31/60.0]
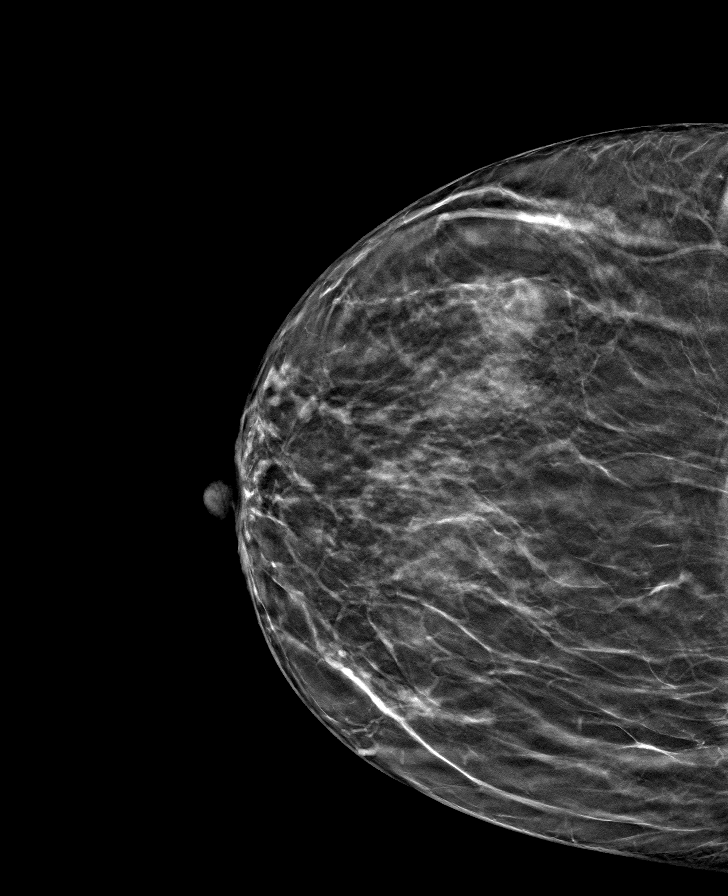

[L CC tomo · tomo slice 31/62.0]
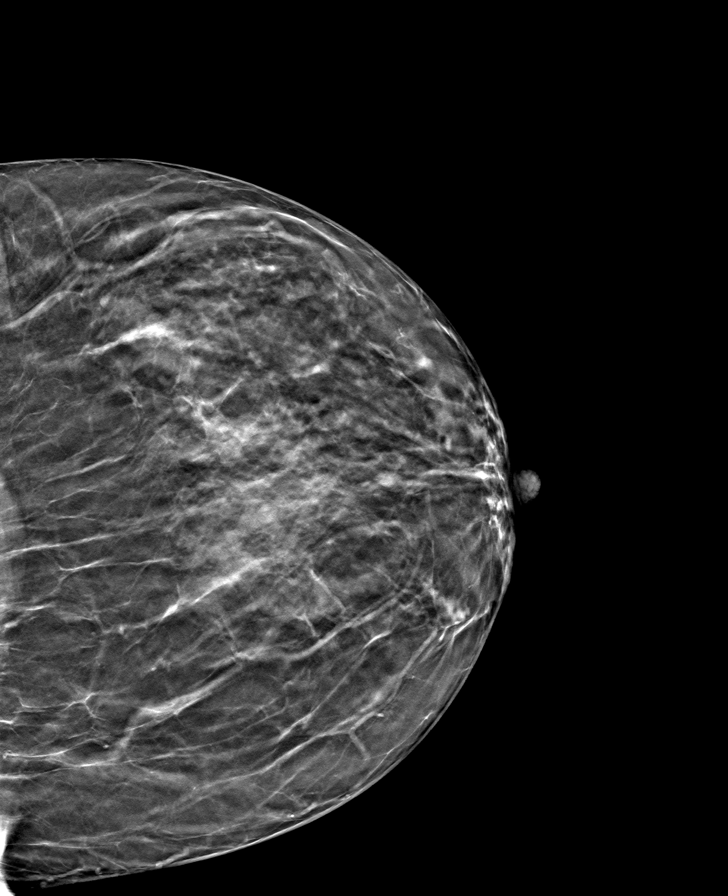

[8 of 24 positions shown; findings below may reference images not displayed]

ACR Breast Density Category b: There are scattered areas of
fibroglandular density.
FINDINGS: In the left breast, a possible mass warrants further evaluation. In
the right breast, no findings suspicious for malignancy.
IMPRESSION: Further evaluation is suggested for a possible mass in the left
breast.

RECOMMENDATION:
Diagnostic mammogram and possibly ultrasound of the left breast.
(Code:[id number]-X-44M)

The patient will be contacted regarding the findings, and additional
imaging will be scheduled.

BI-RADS CATEGORY  0: Incomplete. Need additional imaging evaluation
and/or prior mammograms for comparison.

## 2022-09-13 ENCOUNTER — Encounter: Payer: Self-pay | Admitting: Family Medicine

## 2022-09-13 ENCOUNTER — Ambulatory Visit: Payer: No Typology Code available for payment source | Admitting: Family Medicine

## 2022-09-13 ENCOUNTER — Other Ambulatory Visit: Payer: Self-pay

## 2022-09-13 VITALS — BP 122/80 | Ht 67.0 in | Wt 209.0 lb

## 2022-09-13 DIAGNOSIS — M25519 Pain in unspecified shoulder: Secondary | ICD-10-CM

## 2022-09-13 DIAGNOSIS — M25532 Pain in left wrist: Secondary | ICD-10-CM

## 2022-09-13 MED ORDER — DICLOFENAC SODIUM 1 % EX GEL: 2.0000 g | Freq: Four times a day (QID) | CUTANEOUS | 1 refills | Status: DC

## 2022-09-13 NOTE — Patient Instructions (Signed)
I think your shoulder pain is from a small strain of the long head of the biceps tendon.  This can be pretty painful.  I am going to send in some diclofenac gel which I would like you to apply 3-4 times a day to the shoulder area.  You can also buy this over-the-counter sometimes.  See which is cheaper.  We are also giving you home exercise program for this.  The left wrist strain is of the tendons that move into your hand.  Strain of the tendon is pretty painful as well.  I do think this will resolve but I believe it will resolve quicker if we rest it almost totally for the next 1 to 2 weeks.  I will have you wear a cock up wrist splint/or thumb spica splint as we discussed.  I would wear it all day every day.  You do not have to wear it at night.  Do not do upper body work while we are working on the wrist.  The second week when she is starting the home exercise program and let me see you in 2 weeks.

## 2022-09-13 NOTE — Progress Notes (Signed)
  Heather Kennedy - 57 y.o. female MRN [id number]  Date of birth: 1965/12/07    SUBJECTIVE:      Chief Complaint:/ HPI:  Left wrist pain.  This started after she fell asleep on the couch with her wrist in a extended position for quite some time.  This was several months ago and has not seemed to resolve.  She is right-hand dominant.  The wrist feels stiff.  #2.  Left shoulder pain.  This has been going on several weeks.  Difficult to get comfortable at night.  Aching in nature.  Is interfering with her workouts.   OBJECTIVE: BP 122/80   Ht  (1.702 m)   Wt 209 lb (94.8 kg)   LMP 10/12/2012   BMI 32.73 kg/m   Physical Exam:  Vital signs are reviewed. General: Well-developed female no acute distress WRIST: Bilaterally her wrists are symmetrical.  There is a small amount of soft tissue swelling on the left dorsum but it is very diffuse.  She has full range of motion flexion extension, radial and ulnar motion.  These are nonpainful.  Extension against resistance increases her pain.  SHOULDERS: Symmetrical.  Strength and range of motion is intact for all planes the rotator cuff and bilaterally are symmetrical.  She has mild tenderness to palpation over the long head of the left biceps.  Deep palpation here reproduces her pain. ASSESSMENT & PLAN:  See problem based charting & AVS for pt instructions. No problem-specific Assessment & Plan notes found for this encounter. #1.  Wrist pain: Been quite a while and this has not resolved on its own.  She is quite active and works out regularly so she may just not have rested enough.  We discussed options.  I will place her in a cock up wrist splint that she will obtain on line.  She will wear it for the next 2 weeks.  She does not have to wear it at night.  During the second week she will come out of it once or twice a day and do her home exercise program. #2.  Tendinitis of the shoulder.  Home exercise program.  Icing.  Diclofenac gel.  Follow-up 2  weeks to 4 weeks for shoulder.

## 2022-09-17 ENCOUNTER — Ambulatory Visit: Payer: No Typology Code available for payment source | Admitting: Sports Medicine

## 2022-09-23 ENCOUNTER — Encounter (HOSPITAL_BASED_OUTPATIENT_CLINIC_OR_DEPARTMENT_OTHER): Payer: Self-pay | Admitting: Cardiovascular Disease

## 2022-09-23 ENCOUNTER — Ambulatory Visit (HOSPITAL_BASED_OUTPATIENT_CLINIC_OR_DEPARTMENT_OTHER): Payer: No Typology Code available for payment source | Admitting: Cardiovascular Disease

## 2022-09-23 VITALS — BP 114/82 | HR 62 | Ht 67.0 in | Wt 218.2 lb

## 2022-09-23 DIAGNOSIS — I10 Essential (primary) hypertension: Secondary | ICD-10-CM

## 2022-09-23 DIAGNOSIS — R011 Cardiac murmur, unspecified: Secondary | ICD-10-CM

## 2022-09-23 DIAGNOSIS — R600 Localized edema: Secondary | ICD-10-CM | POA: Insufficient documentation

## 2022-09-23 DIAGNOSIS — R0609 Other forms of dyspnea: Secondary | ICD-10-CM

## 2022-09-23 DIAGNOSIS — I440 Atrioventricular block, first degree: Secondary | ICD-10-CM

## 2022-09-23 NOTE — Assessment & Plan Note (Signed)
Blood pressure well-controlled on HCTZ.  She has been taking extra doses to help with her lower extremity edema.  Will check a BNP and BMP today, as she does not seem to be volume overloaded on exam.  I suspect this is more due to venous insufficiency.

## 2022-09-23 NOTE — Patient Instructions (Signed)
Medication Instructions:  Your physician recommends that you continue on your current medications as directed. Please refer to the Current Medication list given to you today.  *If you need a refill on your cardiac medications before your next appointment, please call your pharmacy*  Lab Work: BMET/BNP TODAY   If you have labs (blood work) drawn today and your tests are completely normal, you will receive your results only by: MyChart Message (if you have MyChart) OR A paper copy in the mail If you have any lab test that is abnormal or we need to change your treatment, we will call you to review the results.  Testing/Procedures: Your physician has requested that you have an echocardiogram. Echocardiography is a painless test that uses sound waves to create images of your heart. It provides your doctor with information about the size and shape of your heart and how well your heart's chambers and valves are working. This procedure takes approximately one hour. There are no restrictions for this procedure. Please do NOT wear cologne, perfume, aftershave, or lotions (deodorant is allowed). Please arrive 15 minutes prior to your appointment time.  Your physician has requested that you have a lexiscan myoview. For further information please visit [email or web address] Please follow instruction sheet, as given.  Follow-Up: At [facility], you and your health needs are our priority.  As part of our continuing mission to provide you with exceptional heart care, we have created designated Provider Care Teams.  These Care Teams include your primary Cardiologist (physician) and Advanced Practice Providers (APPs -  Physician Assistants and Nurse Practitioners) who all work together to provide you with the care you need, when you need it.  We recommend signing up for the patient portal called "MyChart".  Sign up information is provided on this After Visit Summary.  MyChart is used to connect with  patients for Virtual Visits (Telemedicine).  Patients are able to view lab/test results, encounter notes, upcoming appointments, etc.  Non-urgent messages can be sent to your provider as well.   To learn more about what you can do with MyChart, go to [email or web address].    Your next appointment:   2 month(s)  Provider:   Gillian Shields, NP    6 MONTHS WITH DR [facility]  Other Instructions  Your Physician has requested you have a Myocardial Perfusion Imaging Study.   Please arrive 15 minutes prior to your appointment time for registration and insurance purposes.   The test will take approximately 3 to 4 hours to complete; you may bring reading material. If someone comes with you to your appointment, they will need to remain in the main lobby due to limited testing space in the testing area. **If you are pregnant or breastfeeding, please notify the nuclear lab prior to your appointment**   How to prepare for your test:  - Do not eat or drink 3 hours prior to your test, except you may have water  - Do not consume products containing caffeine ( regular or decaf) 12 hours prior to your test ( coffee, chocolate, sodas or teas)  - Do bring a list of your current medications with you. If not listed below, you may take your medications as normal (Hold beta blocker- 24 hour prior for exercise myoview)   - Do wear comfortable clothes (no dresses or overalls) and walking shoes ( tennis shoes preferred), no heel or open toe shoes are allowed - Do not wear cologne, perfume, aftershave, or lotions ( deodorant is allowed)  -  If these instructions are not followed, your test will be rescheduled   If you cannot keep your appointment, please provide 24 hours notice to the Nuclear Lab, to avoid a possible $50 charge to your account!

## 2022-09-23 NOTE — Progress Notes (Signed)
Cardiology Office Note:    Date:  09/23/2022   ID:  Heather Kennedy, DOB 14-Jun-1965, MRN [id number]  PCP:  Irven Coe, MD   [facility] Providers Cardiologist:  None     Referring MD: Salley Scarlet, MD    History of Present Illness:    Heather Kennedy is a 57 y.o. female with a hx of hypertension, prediabetes, asthma, and anxiety, and obesity here for evaluation of abnormal EKG. She was seen by Dr. Jeanice Lim on 07/08/2022. EKG was performed and showed first degree AV block and HR 58 bpm. She also noted to have non-pitting edema that was thought to be secondary to Wellbutrin. She had started Wellbutrin previously secondary to some anxiety and as an alternative for attention deficit. It was noted that she had a 2D echo in 2019 revealing diastolic dysfunction. LVEF was 60-65%, trivial mitral regurgitation, and mild tricuspid regurgitation.  Today, she states she is feeling pretty good. However, since 2018 she has struggled with intermittent LE edema associated with fatigue. By 10 AM, she begins to notice swelling in her legs, which gradually increases throughout the day. In the mornings her swelling is usually resolved. She will sometimes start taking 50 mg HCTZ for a while which improves her swelling. She may then take HCTZ every other day. In general, her swelling is not as severe when her weight is lower, such as 190 lbs. Over the last 6 months she has been regaining the same weight in cycles. Since 4/10 she reports a gain of 9 lbs. She has the most success with weight loss when she is fasting. She is conscientious of her diet, including monitoring her sodium intake. Usually she drinks coffee, water, or sugar-free gatorade. She works as a Designer, jewellery. She used to use short compression socks while walking and traveling, which helped. Now while traveling she will stop and walk every hour. For exercise she walks 1 to 1.5 hours in the evenings, maybe 4 miles. Strength training 3 days a week.  Hiking up to twice a week. She will also ride her bike. She denies any anginal symptoms with exercise, but may develop occasional shortness of breath. Additionally she has noticed decreased endurance levels overall. Her heart rate may be as high as 165 bpm while exercising. Additionally she endorses ongoing constipation issues. She denies any palpitations, chest pain, lightheadedness, headaches, syncope, orthopnea, or PND.   Past Medical History:  Diagnosis Date   First degree AV block 09/23/2022   Lower extremity edema 09/23/2022    Past Surgical History:  Procedure Laterality Date   BREAST BIOPSY Right 2002   benign    Current Medications: Current Meds  Medication Sig   albuterol (VENTOLIN HFA) 108 (90 Base) MCG/ACT inhaler Inhale 2 puffs into the lungs as needed for wheezing or shortness of breath.   diclofenac Sodium (VOLTAREN) 1 % GEL Apply 2 g topically 4 (four) times daily.   hydrochlorothiazide (HYDRODIURIL) 25 MG tablet Take 25 mg by mouth daily.   LINZESS 145 MCG CAPS capsule Take 145 mcg by mouth daily.     Allergies:   Iodine, Shellfish allergy, Amoxicillin-pot clavulanate, Keflex [cephalexin], Ampicillin, Gabapentin, and Other   Social History   Socioeconomic History   Marital status: Married    Spouse name: Not on file   Number of children: Not on file   Years of education: Not on file   Highest education level: Not on file  Occupational History   Not on file  Tobacco Use  Smoking status: Never   Smokeless tobacco: Never  Substance and Sexual Activity   Alcohol use: Not on file   Drug use: Not on file   Sexual activity: Not on file  Other Topics Concern   Not on file  Social History Narrative   Not on file   Social Determinants of Health   Financial Resource Strain: Not on file  Food Insecurity: No Food Insecurity (09/23/2022)   Hunger Vital Sign    Worried About Running Out of Food in the Last Year: Never true    Ran Out of Food in the Last Year:  Never true  Transportation Needs: No Transportation Needs (09/23/2022)   PRAPARE - Administrator, Civil Service (Medical): No    Lack of Transportation (Non-Medical): No  Physical Activity: Sufficiently Active (09/23/2022)   Exercise Vital Sign    Days of Exercise per Week: 5 days    Minutes of Exercise per Session: 60 min  Stress: Not on file  Social Connections: Not on file     Family History: The patient's family history includes COPD in her maternal grandmother; Heart attack in her maternal uncle; Heart disease in her sister; Hypertension in her maternal grandmother, mother, sister, and sister; Seizures in her maternal uncle; Thyroid disease in her mother.  ROS:   Please see the history of present illness.    (+) BLE edema (+) Fatigue (+) Shortness of breath All other systems reviewed and are negative.  EKGs/Labs/Other Studies Reviewed:    The following studies were reviewed today:  Echocardiogram  05/11/2018: Study Conclusions  - Left ventricle: The cavity size was normal. There was mild    concentric hypertrophy. Systolic function was normal. The    estimated ejection fraction was in the range of 60% to 65%. Wall    motion was normal; there were no regional wall motion    abnormalities. Doppler parameters are consistent with abnormal    left ventricular relaxation (grade 1 diastolic dysfunction).    There was no evidence of elevated ventricular filling pressure by    Doppler parameters.  - Aortic valve: Trileaflet; mildly thickened, mildly calcified    leaflets.  - Mitral valve: There was trivial regurgitation.  - Right ventricle: The cavity size was normal. Wall thickness was    normal. Systolic function was normal.  - Right atrium: The atrium was normal in size.  - Tricuspid valve: There was mild regurgitation.  - Pulmonary arteries: Systolic pressure was within the normal    range.  - Inferior vena cava: The vessel was normal in size.  - Pericardium,  extracardiac: There was no pericardial effusion.   EKG:   EKG is personally reviewed. 09/23/2022: Sinus rhythm. Rate 62 bpm. First degree AV block  Recent Labs: No results found for requested labs within last 365 days.   Recent Lipid Panel No results found for: "CHOL", "TRIG", "HDL", "CHOLHDL", "VLDL", "LDLCALC", "LDLDIRECT"       Physical Exam:    Wt Readings from Last 3 Encounters:  09/23/22 218 lb 3.2 oz (99 kg)  09/13/22 209 lb (94.8 kg)  08/02/20 207 lb (93.9 kg)     VS:  BP 114/82 (BP Location: Right Arm, Patient Position: Sitting, Cuff Size: Large)   Pulse 62   Ht 5\' 7"  (1.702 m)   Wt 218 lb 3.2 oz (99 kg)   LMP 10/12/2012   BMI 34.17 kg/m  , BMI Body mass index is 34.17 kg/m. GENERAL:  Well appearing HEENT: Pupils  equal round and reactive, fundi not visualized, oral mucosa unremarkable NECK:  No jugular venous distention, waveform within normal limits, carotid upstroke brisk and symmetric, no bruits, no thyromegaly LUNGS:  Clear to auscultation bilaterally HEART:  RRR.  PMI not displaced or sustained,S1 and S2 within normal limits, no S3, no S4, no clicks, no rubs, 2/6 murmur ABD:  Flat, positive bowel sounds normal in frequency in pitch, no bruits, no rebound, no guarding, no midline pulsatile mass, no hepatomegaly, no splenomegaly EXT:  2 plus pulses throughout, no edema, no cyanosis no clubbing SKIN:  No rashes no nodules NEURO:  Cranial nerves II through XII grossly intact, motor grossly intact throughout PSYCH:  Cognitively intact, oriented to person place and time   ASSESSMENT:    1. Primary hypertension   2. Lower extremity edema   3. First degree AV block   4. DOE (dyspnea on exertion)   5. Murmur    PLAN:    Hypertension Blood pressure well-controlled on HCTZ.  She has been taking extra doses to help with her lower extremity edema.  Will check a BNP and BMP today, as she does not seem to be volume overloaded on exam.  I suspect this is more due to  venous insufficiency.  Lower extremity edema She notes lower extremity edema as well as some exertional intolerance.  Will get an echo and a coronary CTA to evaluate for ischemia and heart failure.  She has a systolic murmur on exam that has been present since at least the age of 58.  First degree AV block No clinical significance   Disposition: FU with APP in 2 months. FU with Capucine Tryon C. Duke Salvia, MD, [facility] in 6 months.  Medication Adjustments/Labs and Tests Ordered: Current medicines are reviewed at length with the patient today.  Concerns regarding medicines are outlined above.   Orders Placed This Encounter  Procedures   Basic metabolic panel   B Nat Peptide   MYOCARDIAL PERFUSION IMAGING   EKG 12-Lead   ECHOCARDIOGRAM COMPLETE   No orders of the defined types were placed in this encounter.  Patient Instructions  Medication Instructions:  Your physician recommends that you continue on your current medications as directed. Please refer to the Current Medication list given to you today.  *If you need a refill on your cardiac medications before your next appointment, please call your pharmacy*  Lab Work: BMET/BNP TODAY   If you have labs (blood work) drawn today and your tests are completely normal, you will receive your results only by: MyChart Message (if you have MyChart) OR A paper copy in the mail If you have any lab test that is abnormal or we need to change your treatment, we will call you to review the results.  Testing/Procedures: Your physician has requested that you have an echocardiogram. Echocardiography is a painless test that uses sound waves to create images of your heart. It provides your doctor with information about the size and shape of your heart and how well your heart's chambers and valves are working. This procedure takes approximately one hour. There are no restrictions for this procedure. Please do NOT wear cologne, perfume, aftershave, or lotions  (deodorant is allowed). Please arrive 15 minutes prior to your appointment time.  Your physician has requested that you have a lexiscan myoview. For further information please visit [email or web address] Please follow instruction sheet, as given.  Follow-Up: At [facility], you and your health needs are our priority.  As part of our continuing  mission to provide you with exceptional heart care, we have created designated Provider Care Teams.  These Care Teams include your primary Cardiologist (physician) and Advanced Practice Providers (APPs -  Physician Assistants and Nurse Practitioners) who all work together to provide you with the care you need, when you need it.  We recommend signing up for the patient portal called "MyChart".  Sign up information is provided on this After Visit Summary.  MyChart is used to connect with patients for Virtual Visits (Telemedicine).  Patients are able to view lab/test results, encounter notes, upcoming appointments, etc.  Non-urgent messages can be sent to your provider as well.   To learn more about what you can do with MyChart, go to [email or web address].    Your next appointment:   2 month(s)  Provider:   Gillian Shields, NP    6 MONTHS WITH DR [facility]  Other Instructions  Your Physician has requested you have a Myocardial Perfusion Imaging Study.   Please arrive 15 minutes prior to your appointment time for registration and insurance purposes.   The test will take approximately 3 to 4 hours to complete; you may bring reading material. If someone comes with you to your appointment, they will need to remain in the main lobby due to limited testing space in the testing area. **If you are pregnant or breastfeeding, please notify the nuclear lab prior to your appointment**   How to prepare for your test:  - Do not eat or drink 3 hours prior to your test, except you may have water  - Do not consume products containing caffeine ( regular or  decaf) 12 hours prior to your test ( coffee, chocolate, sodas or teas)  - Do bring a list of your current medications with you. If not listed below, you may take your medications as normal (Hold beta blocker- 24 hour prior for exercise myoview)   - Do wear comfortable clothes (no dresses or overalls) and walking shoes ( tennis shoes preferred), no heel or open toe shoes are allowed - Do not wear cologne, perfume, aftershave, or lotions ( deodorant is allowed)  - If these instructions are not followed, your test will be rescheduled   If you cannot keep your appointment, please provide 24 hours notice to the Nuclear Lab, to avoid a possible $50 charge to your account!     I,Mathew Stumpf,acting as a Neurosurgeon for Chilton Si, MD.,have documented all relevant documentation on the behalf of Chilton Si, MD,as directed by  Chilton Si, MD while in the presence of Chilton Si, MD.  I, Ranyah Groeneveld C. Duke Salvia, MD have reviewed all documentation for this visit.  The documentation of the exam, diagnosis, procedures, and orders on 09/23/2022 are all accurate and complete.   Signed, Chilton Si, MD  09/23/2022 12:59 PM    [facility]

## 2022-09-23 NOTE — Assessment & Plan Note (Signed)
She notes lower extremity edema as well as some exertional intolerance.  Will get an echo and a coronary CTA to evaluate for ischemia and heart failure.  She has a systolic murmur on exam that has been present since at least the age of 38.

## 2022-09-23 NOTE — Assessment & Plan Note (Signed)
No clinical significance. °

## 2022-09-24 LAB — BASIC METABOLIC PANEL
BUN/Creatinine Ratio: 20 (ref 9–23)
BUN: 18 mg/dL (ref 6–24)
CO2: 24 mmol/L (ref 20–29)
Calcium: 10.4 mg/dL — ABNORMAL HIGH (ref 8.7–10.2)
Chloride: 102 mmol/L (ref 96–106)
Creatinine, Ser: 0.89 mg/dL (ref 0.57–1.00)
Glucose: 99 mg/dL (ref 70–99)
Potassium: 4 mmol/L (ref 3.5–5.2)
Sodium: 141 mmol/L (ref 134–144)
eGFR: 76 mL/min/{1.73_m2} (ref >59)

## 2022-09-24 LAB — BRAIN NATRIURETIC PEPTIDE: BNP: 24.8 pg/mL (ref 0.0–100.0)

## 2022-10-01 ENCOUNTER — Encounter (HOSPITAL_COMMUNITY): Payer: Self-pay | Admitting: *Deleted

## 2022-10-01 ENCOUNTER — Telehealth (HOSPITAL_COMMUNITY): Payer: Self-pay | Admitting: *Deleted

## 2022-10-01 NOTE — Telephone Encounter (Signed)
Left message on voicemail in reference to upcoming appointment scheduled for 10/09/22 Phone number given for a call back so details instructions can be given. Letter with instructions sent to my chart.  Marlina Cataldi Jacqueline   

## 2022-10-09 ENCOUNTER — Ambulatory Visit (HOSPITAL_COMMUNITY): Payer: No Typology Code available for payment source | Attending: Internal Medicine

## 2022-10-09 DIAGNOSIS — R0609 Other forms of dyspnea: Secondary | ICD-10-CM

## 2022-10-09 DIAGNOSIS — I10 Essential (primary) hypertension: Secondary | ICD-10-CM | POA: Diagnosis present

## 2022-10-09 LAB — MYOCARDIAL PERFUSION IMAGING
LV dias vol: 80 mL (ref 46–106)
LV sys vol: 27 mL
Nuc Stress EF: 67 %
Peak HR: 92 {beats}/min
Rest HR: 47 {beats}/min
Rest Nuclear Isotope Dose: 10.6 mCi
SDS: 3
SRS: 3
SSS: 6
ST Depression (mm): 0 mm
Stress Nuclear Isotope Dose: 32.5 mCi
TID: 0.99

## 2022-10-09 MED ORDER — REGADENOSON 0.4 MG/5ML IV SOLN
0.4000 mg | Freq: Once | INTRAVENOUS | Status: AC
Start: 2022-10-09 — End: 2022-10-09
  Administered 2022-10-09: 0.4 mg via INTRAVENOUS

## 2022-10-09 MED ORDER — TECHNETIUM TC 99M TETROFOSMIN IV KIT
10.6000 | PACK | Freq: Once | INTRAVENOUS | Status: AC | PRN
  Administered 2022-10-09: 10.6 via INTRAVENOUS

## 2022-10-09 MED ORDER — TECHNETIUM TC 99M TETROFOSMIN IV KIT
32.5000 | PACK | Freq: Once | INTRAVENOUS | Status: AC | PRN
  Administered 2022-10-09: 32.5 via INTRAVENOUS

## 2022-10-10 ENCOUNTER — Ambulatory Visit: Payer: No Typology Code available for payment source | Admitting: Sports Medicine

## 2022-10-18 ENCOUNTER — Ambulatory Visit (HOSPITAL_BASED_OUTPATIENT_CLINIC_OR_DEPARTMENT_OTHER): Payer: No Typology Code available for payment source

## 2022-10-18 ENCOUNTER — Telehealth (HOSPITAL_BASED_OUTPATIENT_CLINIC_OR_DEPARTMENT_OTHER): Payer: Self-pay | Admitting: Cardiovascular Disease

## 2022-10-18 DIAGNOSIS — R0609 Other forms of dyspnea: Secondary | ICD-10-CM | POA: Diagnosis not present

## 2022-10-18 NOTE — Telephone Encounter (Signed)
Called patient. Reviewed indication for echocardiogram and differences between myoview vs echo. She is agreeable to proceed as scheduled and appreciative of the call.  Alver Sorrow, NP

## 2022-10-18 NOTE — Telephone Encounter (Signed)
Patient stated her MYOCARDIAL PERFUSION results came back normal and she wants to know if she still needs to do her Echocardiogram test today.

## 2022-10-21 LAB — ECHOCARDIOGRAM COMPLETE
AR max vel: 2.61 cm2
AV Area VTI: 2.67 cm2
AV Area mean vel: 2.57 cm2
AV Mean grad: 3 mmHg
AV Peak grad: 6.6 mmHg
Ao pk vel: 1.28 m/s
Area-P 1/2: 4.06 cm2
Calc EF: 73.8 %
S' Lateral: 2.25 cm
Single Plane A2C EF: 77 %
Single Plane A4C EF: 70.3 %

## 2022-10-25 ENCOUNTER — Encounter: Payer: Self-pay | Admitting: Family Medicine

## 2022-10-25 ENCOUNTER — Ambulatory Visit: Payer: No Typology Code available for payment source | Admitting: Family Medicine

## 2022-10-25 VITALS — BP 114/72 | Ht 67.0 in | Wt 214.0 lb

## 2022-10-25 DIAGNOSIS — M75102 Unspecified rotator cuff tear or rupture of left shoulder, not specified as traumatic: Secondary | ICD-10-CM

## 2022-10-25 DIAGNOSIS — M25532 Pain in left wrist: Secondary | ICD-10-CM

## 2022-10-25 MED ORDER — METHYLPREDNISOLONE ACETATE 40 MG/ML IJ SUSP
40.0000 mg | Freq: Once | INTRAMUSCULAR | Status: AC
  Administered 2022-10-25: 40 mg via INTRA_ARTICULAR

## 2022-10-25 NOTE — Progress Notes (Signed)
[facility] Attending Note: I have seen and examined this patient with the medical student. I have  reviewed the history, physical examination, assessment and plan as documented in the medical student's note.  I agree with the medical student's note and findings, assessment and treatment plan as documented with the following additions or changes:  PROCEDURE: INJECTION: Patient was given informed consent, signed copy in the chart. Appropriate time out was taken. Area prepped and draped in usual sterile fashion. Ethyl chloride was  used for local anesthesia. A 21 gauge 1 1/2 inch needle was used.. 1 cc of methylprednisolone 40 mg/ml plus  4 cc of 1% lidocaine without epinephrine was injected into the left subacromial bursa of shoulder using a(n) posterior approach.   The patient tolerated the procedure well. There were no complications. Post procedure instructions were given.  Left wrist improved but still some pain. Continue pT and brace prn. F/u 4 eeks and if not improving significantly would offer eval by hand surgeon plus/minus MRI as it has not improved as rapidly as I would have expected. (TFCC involvement??) Left subacromial bursitis: CSI today and HEP with f/u 4 weeks. 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 If not improving, could consider further eval and possibly imaging

## 2022-10-25 NOTE — Progress Notes (Signed)
  LAIBA GOLZ - 57 y.o. female MRN [id number]  Date of birth: [birth year]    CHIEF COMPLAINT:   left shoulder and left wrist pain    SUBJECTIVE:   HPI: Heather Kennedy is a 57 year old female presenting to follow up for left shoulder and wrist pain. She states that her shoulder has continued to cause pain and is worse compared to last visit. The patient describes an achy pain in the front of the shoulder that is worsened by movements such as reaching overhead. She has attended about 6 physical therapy sessions to this point for the shoulder and has used Biofreeze without improvement in her symptoms. She denies numbness and tingling in the arm.  The patient reports that her left wrist pain has improved compared to her last visit.    OBJECTIVE: BP 114/72   Ht 5\' 7"  (1.702 m)   Wt 214 lb (97.1 kg)   LMP 10/12/2012   BMI 33.52 kg/m   Physical Exam:  Vital signs are reviewed.  GEN: Alert and oriented, NAD Pulm: Breathing unlabored PSY: normal mood, congruent affect  MSK: Left shoulder - No obvious deformities. Sensation intact throughout upper extremity. Full ROM with forward flexion, abduction of shoulder to 100 degrees. Mildly tender to palpation over proximal bicipital groove. 5/5 strength with internal and external rotation of the shoulder. Positive empty can test on LEFT . Positive O'Brien's test. Negative drop arm test. Positive Lift Off test.   Left wrist - no obvious deformities. Sensation intact. Full ROM. Tender to palpation on dorsal aspect of wrist. 5/5 strength with finger abduction. Negative Tinel's  ASSESSMENT & PLAN:  1. Left Shoulder Pain The most likely etiology of this patient's left shoulder pain is rotator cuff strain. Physical exam findings of positive empty can test as well as this patient's complaint of pain and weakness with overhead reaching is consistent with pathology to the supraspinatus specifically. Given her complaint of pain spreading into the trapezius  muscles, it is likely that her muscles of the neck and upper back are trying to overcompensate for her injury. She will benefit from continuing physical therapy exercises and subacromial cortisone injection today.   After informed written consent timeout was performed, patient wasseated on exam table. Left shoulder was prepped with alcohol swab and utilizing ultrasound guidance, patient's left subacromial space was injected with 3:1 lidocaine: depomedrol. Patient tolerated the procedure well without immediate complications.  - cortisone injection was given into the subacromial space - continue physical therapy exercises for shoulder  - follow up in 4 weeks for shoulder  2. Left Wrist Pain It is likely that the cause of this patient's chronic wrist pain is tendonopathy involving the dorsal wrist. We are reassured that her pain has improved since the last visit, however would expect more improvement by this time. Options were discussed, including obtaining MRI, referral to hand specialist, and continuing with physical therapy. The patient elected to continue with physical therapy and follow up in 4 weeks.  - continue physical therapy and follow up in 4 weeks  Eustaquio Boyden, MS4

## 2022-11-07 ENCOUNTER — Other Ambulatory Visit (HOSPITAL_BASED_OUTPATIENT_CLINIC_OR_DEPARTMENT_OTHER): Payer: Self-pay

## 2022-11-07 MED ORDER — ZEPBOUND 2.5 MG/0.5ML ~~LOC~~ SOAJ
2.5000 mg | SUBCUTANEOUS | 0 refills | Status: DC
  Filled 2022-11-07: qty 2, 28d supply, fill #0

## 2022-11-20 ENCOUNTER — Other Ambulatory Visit: Payer: Self-pay | Admitting: Family Medicine

## 2022-11-25 ENCOUNTER — Ambulatory Visit (HOSPITAL_BASED_OUTPATIENT_CLINIC_OR_DEPARTMENT_OTHER): Payer: No Typology Code available for payment source | Admitting: Family

## 2022-12-02 ENCOUNTER — Other Ambulatory Visit: Payer: No Typology Code available for payment source

## 2022-12-04 ENCOUNTER — Other Ambulatory Visit (HOSPITAL_BASED_OUTPATIENT_CLINIC_OR_DEPARTMENT_OTHER): Payer: Self-pay

## 2022-12-04 MED ORDER — ZEPBOUND 5 MG/0.5ML ~~LOC~~ SOAJ
5.0000 mg | SUBCUTANEOUS | 1 refills | Status: AC
  Filled 2022-12-04: qty 2, 28d supply, fill #0
  Filled 2023-01-03 (×3): qty 2, 28d supply, fill #1

## 2022-12-06 ENCOUNTER — Other Ambulatory Visit: Payer: Self-pay

## 2022-12-06 ENCOUNTER — Ambulatory Visit: Payer: No Typology Code available for payment source | Admitting: Family Medicine

## 2022-12-06 ENCOUNTER — Encounter: Payer: Self-pay | Admitting: Family Medicine

## 2022-12-06 VITALS — BP 110/74 | Ht 67.0 in | Wt 211.0 lb

## 2022-12-06 DIAGNOSIS — M25532 Pain in left wrist: Secondary | ICD-10-CM | POA: Diagnosis not present

## 2022-12-06 DIAGNOSIS — M75102 Unspecified rotator cuff tear or rupture of left shoulder, not specified as traumatic: Secondary | ICD-10-CM | POA: Diagnosis not present

## 2022-12-06 NOTE — Patient Instructions (Signed)
For the left wrist, start doing a little bit of stretching right before you go to bed.  You can wear the brace as needed.  I do think it is getting aggravated little bit extra because you are trying to protect that left shoulder.  Will reevaluate when I see you back.  For the left shoulder, our trainer has given you some very specific exercises to do.  Please avoid any exercises that cause the shoulder to be uncomfortable.  When I see you back in a month, we will discuss other options.  Please call in the interim if you have any new or worsening symptoms.  Great to see you!

## 2022-12-06 NOTE — Progress Notes (Signed)
  Heather Kennedy - 57 y.o. female MRN [id number]  Date of birth: 1966-01-14    SUBJECTIVE:      Chief Complaint:/ HPI:  #1.  Shoulder pain, left.  Shot really helped her symptoms at least 40 to 60%.  She then was on a cruise and when she came back to the gym she tweaked the left shoulder doing some overhead light weights.  Since then it has been more aggravating.  She has a Psychologist, educational and he has been having her use some exercise bands to do the clock face on the wall.  Certain positions really aggravate it. 2.  Left wrist pain: Wrist is much better but not 100%.  Probably 50% better.  The brace really seems to help when she is doing activities.  She does note that whether she wears the brace during the night or not she wakes up with significant left wrist stiffness.  This lasts about 15 minutes and then she is pretty good to go.  Still cannot pull weeds with the left hand.    OBJECTIVE: BP 110/74   Ht 5\' 7"  (1.702 m)   Wt 211 lb (95.7 kg)   LMP 10/12/2012   BMI 33.05 kg/m   Physical Exam:  Vital signs are reviewed. GEN WD WN NAD SHOULDER: Left.  Full range of motion.  Pain with overhead extension past 110 to 120 degrees.  Pain is mostly in the anterior part of her shoulder over the biceps tendon.  Bicep tendon is not particularly tender to palpation or activation.  External and internal rotation range of motion and strength normal. WRIST: Mild pain with resisted extension.  Full range of motion.  No swelling.  ULTRASOUND: Left shoulder: Bicep tendon sheath has some small amount of fluid in it and appears somewhat ragged in longitudinal view.  Supraspinatus and subscapularis muscle are intact but both reveal some calcifications.  She has no sign of impingement on active motion.  ASSESSMENT & PLAN:  See problem based charting & AVS for pt instructions. No problem-specific Assessment & Plan notes found for this encounter.

## 2022-12-06 NOTE — Assessment & Plan Note (Signed)
Small amount of improvement.  The brace helps.  I do think she is continue to aggravate the wrist by trying to protect the left shoulder.  For right now, we will have her continue the brace as needed and follow-up the wrist in a month.  I will also add some range of motion exercises right before bed.

## 2022-12-06 NOTE — Assessment & Plan Note (Signed)
Ultrasound showed small amount of fluid around the biceps tendon.  She has small calcifications in the subscapularis and supraspinatus tendons but they are intact.  Will curtail some of her activities with her trainer for now, specifically no overhead or uncomfortable positions.  Will put her on home exercise program specifically for rotator cuff syndrome and we will see her back in 4 weeks.

## 2022-12-16 ENCOUNTER — Ambulatory Visit
Admission: RE | Admit: 2022-12-16 | Discharge: 2022-12-16 | Disposition: A | Discharge status: Home / Self Care | Payer: No Typology Code available for payment source | Source: Ambulatory Visit | Attending: Family Medicine | Admitting: Family Medicine

## 2022-12-16 DIAGNOSIS — N6320 Unspecified lump in the left breast, unspecified quadrant: Secondary | ICD-10-CM

## 2022-12-31 ENCOUNTER — Ambulatory Visit (HOSPITAL_BASED_OUTPATIENT_CLINIC_OR_DEPARTMENT_OTHER): Payer: No Typology Code available for payment source | Admitting: Family

## 2022-12-31 ENCOUNTER — Encounter (HOSPITAL_BASED_OUTPATIENT_CLINIC_OR_DEPARTMENT_OTHER): Payer: Self-pay | Admitting: Family

## 2022-12-31 VITALS — BP 113/77 | HR 81 | Ht 67.0 in | Wt 211.3 lb

## 2022-12-31 DIAGNOSIS — R600 Localized edema: Secondary | ICD-10-CM

## 2022-12-31 DIAGNOSIS — I872 Venous insufficiency (chronic) (peripheral): Secondary | ICD-10-CM | POA: Diagnosis not present

## 2022-12-31 DIAGNOSIS — I10 Essential (primary) hypertension: Secondary | ICD-10-CM | POA: Diagnosis not present

## 2022-12-31 NOTE — Patient Instructions (Addendum)
Medication Instructions:  Continue your current medications.   *If you need a refill on your cardiac medications before your next appointment, please call your pharmacy*  Testing/Procedures: Your stress test and echocardiogram looked great!  Follow-Up: At [facility], you and your health needs are our priority.  As part of our continuing mission to provide you with exceptional heart care, we have created designated Provider Care Teams.  These Care Teams include your primary Cardiologist (physician) and Advanced Practice Providers (APPs -  Physician Assistants and Nurse Practitioners) who all work together to provide you with the care you need, when you need it.  We recommend signing up for the patient portal called "MyChart".  Sign up information is provided on this After Visit Summary.  MyChart is used to connect with patients for Virtual Visits (Telemedicine).  Patients are able to view lab/test results, encounter notes, upcoming appointments, etc.  Non-urgent messages can be sent to your provider as well.   To learn more about what you can do with MyChart, go to [email or web address].    Your next appointment:   6 month(s)  Provider:   Chilton Si, MD or Gillian Shields, NP    Other Instructions  Heart Healthy Diet Recommendations: A low-salt diet is recommended. Meats should be grilled, baked, or boiled. Avoid fried foods. Focus on lean protein sources like fish or chicken with vegetables and fruits. The American Heart Association is a Chief Technology Officer!  American Heart Association Diet and Lifeystyle Recommendations   Exercise recommendations: The American Heart Association recommends 150 minutes of moderate intensity exercise weekly. Try 30 minutes of moderate intensity exercise 4-5 times per week. This could include walking, jogging, or swimming.  To prevent or reduce lower extremity swelling: Eat a low salt diet. Salt makes the body hold onto extra fluid which  causes swelling. Sit with legs elevated. For example, in the recliner or on an ottoman.  Wear knee-high compression stockings during the daytime. Ones labeled 15-20 mmHg provide good compression.

## 2022-12-31 NOTE — Progress Notes (Signed)
Cardiology Office Note:  .   Date:  01/02/2023  ID:  Heather Kennedy, DOB 05-04-1966, MRN [id number] PCP: Irven Coe, MD  [facility] Providers Cardiologist:  Chilton Si, MD    History of Present Illness: .   Heather Kennedy is a 57 y.o. female with hx of HTN, prediabetes, asthma, anxiety, obesity.   Echo 2019 LVEF 60-65%, diastolic dysfunction, trivial MR/TR. Intermittent LE edema since 2018. Swelling is better when weight is lower.   Last seen 09/23/22 with BP controlled on hydrochlorothiazide. BMP/BNP updated with no volume overload and intermittent edema felt to be due to venous insufficiency. Echo 09/2022 normal LVEF, gr1DD, trivial MR. Myoview 09/2022 low risk with no ischemia.   Presents today for follow up independently. 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 Intermittent LE edema similar to previous. Wearing compression stockings intermittently. Riding her bike for exercise as well as strength training at the gym. Tried [facility] for a month but only lost one pound. Then transitioned to Zepbound. Zepbound recently increased by [facility].   Reports no shortness of breath nor dyspnea on exertion. Reports no chest pain, pressure, or tightness. No orthopnea, PND. Reports no palpitations.    ROS: Please see the history of present illness.    All other systems reviewed and are negative.   Studies Reviewed: .        Cardiac Studies & Procedures     STRESS TESTS  MYOCARDIAL PERFUSION IMAGING 10/09/2022  Narrative   The study is normal. The study is low risk.   No ST deviation was noted.   Left ventricular function is normal. Nuclear stress EF: 67 %. The left ventricular ejection fraction is hyperdynamic (>65%). End diastolic cavity size is normal.   ECHOCARDIOGRAM  ECHOCARDIOGRAM COMPLETE 10/18/2022  Narrative ECHOCARDIOGRAM REPORT    Patient Name:   Heather Kennedy Single Date of Exam: 10/18/2022 Medical Rec #:  [id number]       Height:        67.0 in Accession #:    [id number]      Weight:       218.0 lb Date of Birth:  [birth year]        BSA:          2.098 m Patient Age:    57 years        BP:           115/80 mmHg Patient Gender: F               HR:           57 bpm. Exam Location:  Outpatient  Procedure: 2D Echo, 3D Echo, Cardiac Doppler, Color Doppler and Strain Analysis  Indications:    R06.9 DOE; R60.0 Lower extremity edema; R94.31 Abnormal EKG  History:        Patient has prior history of Echocardiogram examinations, most recent 05/11/2018. Abnormal ECG, Arrythmias:1st degree AV block, Signs/Symptoms:Dyspnea and Edema; Risk Factors:Hypertension and Non-Kennedy. Patient denies chest pain. She does have minimal DOE with intermittent leg edema.  Sonographer:    Carlos American RVT, RDCS (AE), RDMS Referring Phys: [id number] TIFFANY [place]  IMPRESSIONS   1. Left ventricular ejection fraction, by estimation, is 65 to 70%. The left ventricle has normal function. The left ventricle has no regional wall motion abnormalities. There is mild left ventricular hypertrophy. Left ventricular diastolic parameters were normal. The average left ventricular global longitudinal strain is -19.5 %. The global longitudinal strain is normal.  2. Right ventricular systolic function is normal. The right ventricular size is normal. There is normal pulmonary artery systolic pressure. 3. Left atrial size was mild to moderately dilated. 4. Right atrial size was moderately dilated. 5. The mitral valve is normal in structure. Trivial mitral valve regurgitation. No evidence of mitral stenosis. 6. The aortic valve is tricuspid. There is mild calcification of the aortic valve. There is mild thickening of the aortic valve. Aortic valve regurgitation is not visualized. No aortic stenosis is present. 7. The inferior vena cava is normal in size with greater than 50% respiratory variability, suggesting right atrial pressure of 3 mmHg.  Comparison(s): No  significant change from prior study. EF 60%, mild LVH.  Conclusion(s)/Recommendation(s): Otherwise normal echocardiogram, with minor abnormalities described in the report.  FINDINGS Left Ventricle: Left ventricular ejection fraction, by estimation, is 65 to 70%. The left ventricle has normal function. The left ventricle has no regional wall motion abnormalities. The average left ventricular global longitudinal strain is -19.5 %. The global longitudinal strain is normal. The left ventricular internal cavity size was normal in size. There is mild left ventricular hypertrophy. Left ventricular diastolic parameters were normal.  Right Ventricle: The right ventricular size is normal. Right vetricular wall thickness was not well visualized. Right ventricular systolic function is normal. There is normal pulmonary artery systolic pressure. The tricuspid regurgitant velocity is 2.57 m/s, and with an assumed right atrial pressure of 3 mmHg, the estimated right ventricular systolic pressure is 29.4 mmHg.  Left Atrium: Left atrial size was mild to moderately dilated.  Right Atrium: Right atrial size was moderately dilated.  Pericardium: There is no evidence of pericardial effusion.  Mitral Valve: The mitral valve is normal in structure. Trivial mitral valve regurgitation. No evidence of mitral valve stenosis.  Tricuspid Valve: The tricuspid valve is normal in structure. Tricuspid valve regurgitation is trivial. No evidence of tricuspid stenosis.  Aortic Valve: The aortic valve is tricuspid. There is mild calcification of the aortic valve. There is mild thickening of the aortic valve. Aortic valve regurgitation is not visualized. No aortic stenosis is present. Aortic valve mean gradient measures 3.0 mmHg. Aortic valve peak gradient measures 6.6 mmHg. Aortic valve area, by VTI measures 2.67 cm.  Pulmonic Valve: The pulmonic valve was not well visualized. Pulmonic valve regurgitation is mild. No evidence  of pulmonic stenosis.  Aorta: The aortic root, ascending aorta, aortic arch and descending aorta are all structurally normal, with no evidence of dilitation or obstruction.  Venous: The inferior vena cava is normal in size with greater than 50% respiratory variability, suggesting right atrial pressure of 3 mmHg.  IAS/Shunts: The atrial septum is grossly normal.   LEFT VENTRICLE PLAX 2D LVIDd:         4.41 cm     Diastology LVIDs:         2.25 cm     LV e' medial:    7.83 cm/s LV PW:         0.81 cm     LV E/e' medial:  14.7 LV IVS:        1.10 cm     LV e' lateral:   9.14 cm/s LVOT diam:     2.10 cm     LV E/e' lateral: 12.6 LV SV:         73 LV SV Index:   35          2D Longitudinal Strain LVOT Area:     3.46 cm  2D Strain GLS (A2C):   -20.4 % 2D Strain GLS (A3C):   -17.7 % 2D Strain GLS (A4C):   -20.5 % LV Volumes (MOD)           2D Strain GLS Avg:     -19.5 % LV vol d, MOD A2C: 79.5 ml LV vol d, MOD A4C: 92.6 ml LV vol s, MOD A2C: 18.3 ml LV vol s, MOD A4C: 27.5 ml 3D Volume EF: LV SV MOD A2C:     61.2 ml 3D EF:        70 % LV SV MOD A4C:     92.6 ml LV EDV:       130 ml LV SV MOD BP:      67.3 ml LV ESV:       39 ml LV SV:        91 ml  RIGHT VENTRICLE RV Basal diam:  3.39 cm RV S prime:     15.90 cm/s TAPSE (M-mode): 3.4 cm  LEFT ATRIUM             Index        RIGHT ATRIUM           Index LA diam:        3.60 cm 1.72 cm/m   RA Area:     24.00 cm LA Vol (A2C):   54.5 ml 25.98 ml/m  RA Volume:   85.90 ml  40.95 ml/m LA Vol (A4C):   76.6 ml 36.52 ml/m LA Biplane Vol: 65.7 ml 31.32 ml/m AORTIC VALVE                    PULMONIC VALVE AV Area (Vmax):    2.61 cm     PV Vmax:          1.18 m/s AV Area (Vmean):   2.57 cm     PV Peak grad:     5.6 mmHg AV Area (VTI):     2.67 cm     PR End Diast Vel: 5.48 msec AV Vmax:           128.00 cm/s AV Vmean:          86.500 cm/s AV VTI:            0.274 m AV Peak Grad:      6.6 mmHg AV Mean Grad:      3.0 mmHg LVOT  Vmax:         96.60 cm/s LVOT Vmean:        64.100 cm/s LVOT VTI:          0.211 m LVOT/AV VTI ratio: 0.77  AORTA Ao Root diam: 3.10 cm Ao Asc diam:  3.60 cm Ao Arch diam: 3.3 cm  MITRAL VALVE                TRICUSPID VALVE MV Area (PHT): 4.06 cm     TR Peak grad:   26.4 mmHg MV Decel Time: 187 msec     TR Vmax:        257.00 cm/s MV E velocity: 115.00 cm/s MV A velocity: 69.70 cm/s   SHUNTS MV E/A ratio:  1.65         Systemic VTI:  0.21 m Systemic Diam: 2.10 cm  Jodelle Red MD Electronically signed by Jodelle Red MD Signature Date/Time: 10/21/2022/8:01:26 PM    Final             Risk Assessment/Calculations:  Physical Exam:   VS:  BP 113/77 (BP Location: Left Arm, Patient Position: Sitting, Cuff Size: Large)   Pulse 81   Ht 5\' 7"  (1.702 m)   Wt 211 lb 4.8 oz (95.8 kg)   LMP 10/12/2012   SpO2 100%   BMI 33.09 kg/m    Wt Readings from Last 3 Encounters:  12/31/22 211 lb 4.8 oz (95.8 kg)  12/06/22 211 lb (95.7 kg)  10/25/22 214 lb (97.1 kg)    GEN: Well nourished, well developed in no acute distress NECK: No JVD; No carotid bruits CARDIAC: RRR, no murmurs, rubs, gallops RESPIRATORY:  Clear to auscultation without rales, wheezing or rhonchi  ABDOMEN: Soft, non-tender, non-distended EXTREMITIES:  No edema; No deformity   ASSESSMENT AND PLAN: .    HTN - BP well controlled. Continue current antihypertensive regimen hydrochlorothiazide 25mg  daily.   LE edema - Echo 09/2022 normal LVEF, gr1DD. Likely etiology venous insufficiency. Recommend elevating legs when sitting, compression stockings, low sodium diet.   Obesity - Following with [facility]. Presently on Zepbound. Recommend aiming for 150 minutes of moderate intensity activity per week and following a heart healthy diet.         Dispo: follow up in 6 months  Signed, Alver Sorrow, NP

## 2023-01-03 ENCOUNTER — Other Ambulatory Visit (HOSPITAL_BASED_OUTPATIENT_CLINIC_OR_DEPARTMENT_OTHER): Payer: Self-pay

## 2023-01-06 ENCOUNTER — Encounter: Payer: Self-pay | Admitting: Family Medicine

## 2023-01-10 ENCOUNTER — Ambulatory Visit: Payer: No Typology Code available for payment source | Admitting: Family Medicine

## 2023-01-14 ENCOUNTER — Other Ambulatory Visit (HOSPITAL_BASED_OUTPATIENT_CLINIC_OR_DEPARTMENT_OTHER): Payer: Self-pay

## 2023-01-14 MED ORDER — ZEPBOUND 7.5 MG/0.5ML ~~LOC~~ SOAJ
7.5000 mg | SUBCUTANEOUS | 1 refills | Status: DC
  Filled 2023-01-14: qty 2, 28d supply, fill #0
  Filled 2023-02-07 (×2): qty 2, 28d supply, fill #1

## 2023-02-07 ENCOUNTER — Other Ambulatory Visit (HOSPITAL_BASED_OUTPATIENT_CLINIC_OR_DEPARTMENT_OTHER): Payer: Self-pay

## 2023-02-12 ENCOUNTER — Other Ambulatory Visit (HOSPITAL_BASED_OUTPATIENT_CLINIC_OR_DEPARTMENT_OTHER): Payer: Self-pay

## 2023-02-27 ENCOUNTER — Other Ambulatory Visit (HOSPITAL_BASED_OUTPATIENT_CLINIC_OR_DEPARTMENT_OTHER): Payer: Self-pay

## 2023-02-27 MED ORDER — ONDANSETRON HCL 4 MG PO TABS
4.0000 mg | ORAL_TABLET | Freq: Four times a day (QID) | ORAL | 0 refills | Status: AC | PRN
  Filled 2023-02-27: qty 20, 5d supply, fill #0

## 2023-02-27 MED ORDER — ZEPBOUND 7.5 MG/0.5ML ~~LOC~~ SOAJ
7.5000 mg | SUBCUTANEOUS | 1 refills | Status: AC
  Filled 2023-02-27 – 2023-03-26 (×3): qty 2, 28d supply, fill #0

## 2023-03-26 ENCOUNTER — Other Ambulatory Visit (HOSPITAL_BASED_OUTPATIENT_CLINIC_OR_DEPARTMENT_OTHER): Payer: Self-pay

## 2023-03-28 ENCOUNTER — Ambulatory Visit (HOSPITAL_BASED_OUTPATIENT_CLINIC_OR_DEPARTMENT_OTHER): Payer: No Typology Code available for payment source | Admitting: Cardiovascular Disease

## 2023-04-02 ENCOUNTER — Other Ambulatory Visit (HOSPITAL_BASED_OUTPATIENT_CLINIC_OR_DEPARTMENT_OTHER): Payer: Self-pay

## 2023-04-02 MED ORDER — ZEPBOUND 10 MG/0.5ML ~~LOC~~ SOAJ
10.0000 mg | SUBCUTANEOUS | 1 refills | Status: AC
  Filled 2023-04-02 – 2023-04-23 (×2): qty 2, 28d supply, fill #0

## 2023-04-11 ENCOUNTER — Other Ambulatory Visit (HOSPITAL_BASED_OUTPATIENT_CLINIC_OR_DEPARTMENT_OTHER): Payer: Self-pay

## 2023-04-15 ENCOUNTER — Other Ambulatory Visit (HOSPITAL_BASED_OUTPATIENT_CLINIC_OR_DEPARTMENT_OTHER): Payer: Self-pay

## 2023-04-23 ENCOUNTER — Other Ambulatory Visit (HOSPITAL_BASED_OUTPATIENT_CLINIC_OR_DEPARTMENT_OTHER): Payer: Self-pay

## 2023-05-07 ENCOUNTER — Other Ambulatory Visit (HOSPITAL_BASED_OUTPATIENT_CLINIC_OR_DEPARTMENT_OTHER): Payer: Self-pay

## 2023-05-07 MED ORDER — ZEPBOUND 10 MG/0.5ML ~~LOC~~ SOAJ
10.0000 mg | SUBCUTANEOUS | 1 refills | Status: AC
  Filled 2023-05-29: qty 2, 28d supply, fill #0

## 2023-05-29 ENCOUNTER — Other Ambulatory Visit (HOSPITAL_BASED_OUTPATIENT_CLINIC_OR_DEPARTMENT_OTHER): Payer: Self-pay

## 2023-06-18 ENCOUNTER — Other Ambulatory Visit (HOSPITAL_BASED_OUTPATIENT_CLINIC_OR_DEPARTMENT_OTHER): Payer: Self-pay

## 2023-06-18 MED ORDER — ZEPBOUND 10 MG/0.5ML ~~LOC~~ SOAJ
10.0000 mg | SUBCUTANEOUS | 1 refills | Status: AC
  Filled 2023-06-18 – 2023-06-22 (×2): qty 2, 28d supply, fill #0

## 2023-06-22 ENCOUNTER — Other Ambulatory Visit (HOSPITAL_BASED_OUTPATIENT_CLINIC_OR_DEPARTMENT_OTHER): Payer: Self-pay

## 2023-07-16 ENCOUNTER — Other Ambulatory Visit (HOSPITAL_BASED_OUTPATIENT_CLINIC_OR_DEPARTMENT_OTHER): Payer: Self-pay

## 2023-07-16 MED ORDER — ZEPBOUND 10 MG/0.5ML ~~LOC~~ SOAJ
10.0000 mg | SUBCUTANEOUS | 1 refills | Status: AC
  Filled 2023-07-16: qty 2, 28d supply, fill #0
  Filled 2023-08-22: qty 2, 28d supply, fill #1

## 2023-08-22 ENCOUNTER — Other Ambulatory Visit (HOSPITAL_BASED_OUTPATIENT_CLINIC_OR_DEPARTMENT_OTHER): Payer: Self-pay

## 2023-08-23 ENCOUNTER — Other Ambulatory Visit (HOSPITAL_BASED_OUTPATIENT_CLINIC_OR_DEPARTMENT_OTHER): Payer: Self-pay

## 2023-09-18 ENCOUNTER — Other Ambulatory Visit (HOSPITAL_BASED_OUTPATIENT_CLINIC_OR_DEPARTMENT_OTHER): Payer: Self-pay

## 2023-09-18 MED ORDER — ZEPBOUND 12.5 MG/0.5ML ~~LOC~~ SOAJ
12.5000 mg | SUBCUTANEOUS | 0 refills | Status: DC
  Filled 2023-09-18: qty 2, 28d supply, fill #0

## 2023-09-20 ENCOUNTER — Other Ambulatory Visit (HOSPITAL_BASED_OUTPATIENT_CLINIC_OR_DEPARTMENT_OTHER): Payer: Self-pay

## 2023-10-01 ENCOUNTER — Other Ambulatory Visit (HOSPITAL_BASED_OUTPATIENT_CLINIC_OR_DEPARTMENT_OTHER): Payer: Self-pay

## 2023-10-01 MED ORDER — ZEPBOUND 12.5 MG/0.5ML ~~LOC~~ SOAJ
12.5000 mg | SUBCUTANEOUS | 1 refills | Status: DC
  Filled 2023-10-01 – 2023-10-14 (×2): qty 2, 28d supply, fill #0
  Filled 2023-11-12: qty 2, 28d supply, fill #1

## 2023-10-03 ENCOUNTER — Other Ambulatory Visit: Payer: Self-pay

## 2023-10-03 ENCOUNTER — Other Ambulatory Visit (HOSPITAL_BASED_OUTPATIENT_CLINIC_OR_DEPARTMENT_OTHER): Payer: Self-pay

## 2023-10-03 MED ORDER — VENLAFAXINE HCL ER 37.5 MG PO CP24
37.5000 mg | ORAL_CAPSULE | Freq: Every day | ORAL | 0 refills | Status: AC
  Filled 2023-10-03: qty 30, 30d supply, fill #0

## 2023-10-14 ENCOUNTER — Other Ambulatory Visit: Payer: Self-pay

## 2023-10-16 ENCOUNTER — Other Ambulatory Visit (HOSPITAL_BASED_OUTPATIENT_CLINIC_OR_DEPARTMENT_OTHER): Payer: Self-pay

## 2023-11-04 ENCOUNTER — Other Ambulatory Visit (HOSPITAL_BASED_OUTPATIENT_CLINIC_OR_DEPARTMENT_OTHER): Payer: Self-pay

## 2023-11-04 MED ORDER — BUPROPION HCL ER (XL) 150 MG PO TB24
150.0000 mg | ORAL_TABLET | Freq: Every day | ORAL | 3 refills | Status: AC
Start: 2023-11-04 — End: ?
  Filled 2023-11-04: qty 90, 90d supply, fill #0
  Filled 2024-04-06: qty 90, 90d supply, fill #1

## 2023-11-10 ENCOUNTER — Other Ambulatory Visit (HOSPITAL_BASED_OUTPATIENT_CLINIC_OR_DEPARTMENT_OTHER): Payer: Self-pay

## 2023-11-12 ENCOUNTER — Other Ambulatory Visit (HOSPITAL_BASED_OUTPATIENT_CLINIC_OR_DEPARTMENT_OTHER): Payer: Self-pay

## 2023-11-14 ENCOUNTER — Other Ambulatory Visit (HOSPITAL_BASED_OUTPATIENT_CLINIC_OR_DEPARTMENT_OTHER): Payer: Self-pay

## 2023-12-15 ENCOUNTER — Other Ambulatory Visit: Payer: Self-pay | Admitting: Family Medicine

## 2023-12-15 DIAGNOSIS — N63 Unspecified lump in unspecified breast: Secondary | ICD-10-CM

## 2023-12-18 ENCOUNTER — Inpatient Hospital Stay
Admission: RE | Admit: 2023-12-18 | Discharge: 2023-12-18 | Discharge status: Home / Self Care | Source: Ambulatory Visit | Attending: Family Medicine | Admitting: Family Medicine

## 2023-12-18 DIAGNOSIS — N63 Unspecified lump in unspecified breast: Secondary | ICD-10-CM

## 2023-12-22 ENCOUNTER — Other Ambulatory Visit (HOSPITAL_BASED_OUTPATIENT_CLINIC_OR_DEPARTMENT_OTHER): Payer: Self-pay

## 2023-12-22 MED ORDER — ZEPBOUND 12.5 MG/0.5ML ~~LOC~~ SOAJ
12.5000 mg | SUBCUTANEOUS | 0 refills | Status: DC
  Filled 2023-12-22: qty 2, 28d supply, fill #0

## 2023-12-23 ENCOUNTER — Other Ambulatory Visit (HOSPITAL_BASED_OUTPATIENT_CLINIC_OR_DEPARTMENT_OTHER): Payer: Self-pay

## 2023-12-24 ENCOUNTER — Other Ambulatory Visit (HOSPITAL_BASED_OUTPATIENT_CLINIC_OR_DEPARTMENT_OTHER): Payer: Self-pay

## 2023-12-24 MED ORDER — TIRZEPATIDE-WEIGHT MANAGEMENT 15 MG/0.5ML ~~LOC~~ SOAJ
15.0000 mg | SUBCUTANEOUS | 0 refills | Status: AC
  Filled 2023-12-24: qty 2, 28d supply, fill #0

## 2023-12-25 ENCOUNTER — Other Ambulatory Visit

## 2023-12-25 ENCOUNTER — Encounter

## 2024-01-03 ENCOUNTER — Other Ambulatory Visit (HOSPITAL_BASED_OUTPATIENT_CLINIC_OR_DEPARTMENT_OTHER): Payer: Self-pay

## 2024-01-20 ENCOUNTER — Other Ambulatory Visit (HOSPITAL_BASED_OUTPATIENT_CLINIC_OR_DEPARTMENT_OTHER): Payer: Self-pay

## 2024-01-20 MED ORDER — BUPROPION HCL ER (XL) 300 MG PO TB24
300.0000 mg | ORAL_TABLET | Freq: Every day | ORAL | 2 refills | Status: AC
  Filled 2024-01-20: qty 30, 30d supply, fill #0
  Filled 2024-02-20: qty 30, 30d supply, fill #1

## 2024-02-20 ENCOUNTER — Other Ambulatory Visit (HOSPITAL_BASED_OUTPATIENT_CLINIC_OR_DEPARTMENT_OTHER): Payer: Self-pay

## 2024-02-20 MED ORDER — FLUZONE 0.5 ML IM SUSY
0.5000 mL | PREFILLED_SYRINGE | Freq: Once | INTRAMUSCULAR | 0 refills | Status: AC
  Filled 2024-02-20: qty 0.5, 1d supply, fill #0

## 2024-02-20 MED ORDER — COMIRNATY 30 MCG/0.3ML IM SUSY
0.3000 mL | PREFILLED_SYRINGE | Freq: Once | INTRAMUSCULAR | 0 refills | Status: AC
  Filled 2024-02-20: qty 0.3, 1d supply, fill #0

## 2024-04-06 ENCOUNTER — Other Ambulatory Visit (HOSPITAL_BASED_OUTPATIENT_CLINIC_OR_DEPARTMENT_OTHER): Payer: Self-pay

## 2024-04-11 ENCOUNTER — Other Ambulatory Visit (HOSPITAL_BASED_OUTPATIENT_CLINIC_OR_DEPARTMENT_OTHER): Payer: Self-pay

## 2024-04-11 MED ORDER — BUPROPION HCL ER (XL) 150 MG PO TB24: 150.0000 mg | ORAL_TABLET | Freq: Every morning | ORAL | 3 refills | Status: AC
